# Patient Record
Sex: Female | Born: 1935 | Race: White | Hispanic: No | Marital: Married | Smoking: Never smoker
Health system: Southern US, Community
[De-identification: ages and names within clinical notes are randomized; demographics above are authoritative.]

## PROBLEM LIST (undated history)

## (undated) DIAGNOSIS — Z8489 Family history of other specified conditions: Secondary | ICD-10-CM

## (undated) DIAGNOSIS — R011 Cardiac murmur, unspecified: Secondary | ICD-10-CM

## (undated) DIAGNOSIS — R51 Headache: Secondary | ICD-10-CM

## (undated) DIAGNOSIS — Z8673 Personal history of transient ischemic attack (TIA), and cerebral infarction without residual deficits: Secondary | ICD-10-CM

## (undated) DIAGNOSIS — C801 Malignant (primary) neoplasm, unspecified: Secondary | ICD-10-CM

## (undated) DIAGNOSIS — R410 Disorientation, unspecified: Secondary | ICD-10-CM

## (undated) DIAGNOSIS — R112 Nausea with vomiting, unspecified: Secondary | ICD-10-CM

## (undated) DIAGNOSIS — R569 Unspecified convulsions: Secondary | ICD-10-CM

## (undated) DIAGNOSIS — F419 Anxiety disorder, unspecified: Secondary | ICD-10-CM

## (undated) DIAGNOSIS — I10 Essential (primary) hypertension: Secondary | ICD-10-CM

## (undated) DIAGNOSIS — M1990 Unspecified osteoarthritis, unspecified site: Secondary | ICD-10-CM

## (undated) DIAGNOSIS — Z9889 Other specified postprocedural states: Secondary | ICD-10-CM

## (undated) HISTORY — PX: JOINT REPLACEMENT: SHX530

## (undated) HISTORY — PX: IVC FILTER PLACEMENT (ARMC HX): HXRAD1551

## (undated) HISTORY — PX: EYE SURGERY: SHX253

## (undated) HISTORY — PX: WRIST FRACTURE SURGERY: SHX121

---

## 1993-02-11 HISTORY — PX: BREAST SURGERY: SHX581

## 2010-08-29 ENCOUNTER — Ambulatory Visit (HOSPITAL_COMMUNITY)
Admission: RE | Admit: 2010-08-29 | Discharge: 2010-08-29 | Disposition: A | Discharge status: Home / Self Care | Payer: Medicare Other | Source: Ambulatory Visit | Attending: Orthopedic Surgery | Admitting: Orthopedic Surgery

## 2010-08-29 DIAGNOSIS — Z01812 Encounter for preprocedural laboratory examination: Secondary | ICD-10-CM | POA: Insufficient documentation

## 2010-08-29 LAB — URINALYSIS, ROUTINE W REFLEX MICROSCOPIC
Bilirubin Urine: NEGATIVE
Hgb urine dipstick: NEGATIVE
Protein, ur: NEGATIVE mg/dL
Specific Gravity, Urine: 1.029 (ref 1.005–1.030)
Urobilinogen, UA: 0.2 mg/dL (ref 0.0–1.0)

## 2010-08-29 LAB — CBC
HCT: 37.3 % (ref 36.0–46.0)
MCH: 32.5 pg (ref 26.0–34.0)
MCV: 92.6 fL (ref 78.0–100.0)
Platelets: 197 10*3/uL (ref 150–400)
RBC: 4.03 MIL/uL (ref 3.87–5.11)

## 2010-08-29 LAB — COMPREHENSIVE METABOLIC PANEL
ALT: 19 U/L (ref 0–35)
Alkaline Phosphatase: 130 U/L — ABNORMAL HIGH (ref 39–117)
CO2: 23 mEq/L (ref 19–32)
Calcium: 9.4 mg/dL (ref 8.4–10.5)
Chloride: 93 mEq/L — ABNORMAL LOW (ref 96–112)
GFR calc Af Amer: >60 mL/min (ref >60)
GFR calc non Af Amer: >60 mL/min (ref >60)
Glucose, Bld: 186 mg/dL — ABNORMAL HIGH (ref 70–99)
Sodium: 131 mEq/L — ABNORMAL LOW (ref 135–145)
Total Bilirubin: 0.2 mg/dL — ABNORMAL LOW (ref 0.3–1.2)

## 2010-08-30 ENCOUNTER — Inpatient Hospital Stay (HOSPITAL_COMMUNITY): Payer: Medicare Other

## 2010-08-30 ENCOUNTER — Inpatient Hospital Stay (HOSPITAL_COMMUNITY)
Admission: RE | Admit: 2010-08-30 | Discharge: 2010-08-31 | DRG: 484 | Disposition: A | Discharge status: Home / Self Care | Payer: Medicare Other | Source: Ambulatory Visit | Attending: Orthopedic Surgery | Admitting: Orthopedic Surgery

## 2010-08-30 DIAGNOSIS — S42309A Unspecified fracture of shaft of humerus, unspecified arm, initial encounter for closed fracture: Principal | ICD-10-CM | POA: Diagnosis present

## 2010-08-30 LAB — GLUCOSE, CAPILLARY
Glucose-Capillary: 128 mg/dL — ABNORMAL HIGH (ref 70–99)
Glucose-Capillary: 156 mg/dL — ABNORMAL HIGH (ref 70–99)
Glucose-Capillary: 127 mg/dL — ABNORMAL HIGH (ref 70–99)

## 2010-08-30 LAB — TYPE AND SCREEN: ABO/RH(D): O POS

## 2010-08-30 LAB — ABO/RH: ABO/RH(D): O POS

## 2010-09-20 NOTE — Op Note (Signed)
Kristina Phelps, Kristina Phelps NO.:  [id number]  MEDICAL RECORD NO.:  [id number]  LOCATION:  5004                         FACILITY:  [facility]  PHYSICIAN:  Vania Rea. Kierre Hintz, M.D.  DATE OF BIRTH:  02-16-1935  DATE OF PROCEDURE:  08/30/2010 DATE OF DISCHARGE:                              OPERATIVE REPORT   PREOPERATIVE DIAGNOSIS:  Comminuted displaced left four-part proximal humerus fracture.  POSTOPERATIVE DIAGNOSIS:  Comminuted displaced left four-part proximal humerus fracture.  PROCEDURE:  Left reverse shoulder arthroplasty utilizing a cemented size 10 DePuy stem with a +3 poly and a 42 eccentric glenosphere.  SURGEON:  Vania Rea. Ruhan Borak, MD  ASSISTANT:  Lucita Lora. Shuford, PA-C  ANESTHESIA:  General endotracheal as well as an interscalene block.  ESTIMATED BLOOD LOSS:  350 mL.  DRAINS:  None.  HISTORY:  Ms. Yarbro is a 75 year old female who unfortunately fell 2 days ago sustaining a severely displaced four-part left shoulder fracture-dislocation.  She had undergone an initial closed reduction in the [facility], was referred to our office yesterday where she was evaluated and found to have the above-mentioned severely displaced four-part proximal humeral fracture.  She did appeared to be grossly neurovascularly intact at the time of evaluation, although this was difficult to ascertain due to her degree of pain and guarding.  Her radiographs confirmed the degree of displacement and she was subsequently counseled on treatment options for her shoulder condition.  Preoperatively, I counseled Ms. Jarvie on various treatment options as well as risks versus benefits thereof.  Possible surgical complications were reviewed including potential for bleeding, infection, neurovascular injury, malunion, nonunion, failure of fixation, failure of the implant and stability and possible need for additional surgery. She understands and accepts and  agrees with our planned procedure.  PROCEDURE IN DETAIL:  After undergoing routine preop evaluation, the patient received prophylactic antibiotics and an interscalene block was established in holding area by the Anesthesia Department.  Placed supine on the operative table, underwent smooth induction of a general endotracheal anesthesia.  Placed into beach-chair position and appropriately padded and protected.  The left shoulder girdle region was then sterilely prepped and draped in standard fashion.  Time-out was called.  An anterior approach to the left shoulder was made through a 12- cm incision along the deltopectoral interval.  Skin flaps were elevated and electrocautery was used for hemostasis.  The deltopectoral interval was then identified and cephalic vein was then identified, and retracted laterally with a deltoid.  This interval was then developed from proximal to distal.  A Brown retractor was inflated.  Placed in the subacromial/subdeltoid bursal region.  Conjoined tendon was then retracted medially.  There was a high degree of comminution of the fracture and we identified the biceps tendon and used this to develop the interval between the greater and lesser tuberosities and overlying periosteum was then divided.  The tuberosities were then mobilized and the bulk of the bone fragments attached to these were then debrided away, leaving a small cuff of bone with the adjacent tendon for later repair.  We developed the rotator interval mobilizing the subscapularis and supraspinatus independently.  A series of #2 fiber wires were then passed through  the tendinous portion of the subscapularis as well as the supra and infraspinatus to assist for later repair.  The humeral head articular segment had been spun 180 degrees and impacted against the femoral shaft and this was then removed as a single fragment.  Once we had mobilized the tuberosities in the humeral shaft.  We then  gained access to the glenoid and placed a series of retractors around the periphery of the glenoid and performed a capsulotomy and then resection and removal of the labrum as well as proximal stump of the biceps.  We then placed a guide pin into the center of the glenoid and reamed to a subchondral bony bed and peripheral reamer was then used to complete preparation of the glenoid.  The central peg hole was then drilled with appropriate drill.  The glenoid was irrigated, meticulously cleaned, and then the glenoid baseplate was impacted into position.  We then transfixed baseplate with a superior and inferior locking screws and anterior and posterior nonlocking screws at the appropriate depth with an excellent bony purchase achieved.  Screws were then locked.  A 42 eccentric glenosphere was then placed over the baseplate and screwed into position with excellent fixation.  Once this was completed, we then turned our attention back to the humeral shaft.  We performed an initial fitting of a size.  We then performed hand reaming up to a size 10 and placed a size 10 stem and found that additional preparation of the humeral shaft would be needed.  The size 10 fluted reaming guide was then introduced into the humeral canal at 0 degrees of retroversion and the size 1 reamer was then used to prepare the residual metaphyseal portion of the humerus.  Once this was completed to our satisfaction, the size 10 stem trial was once again placed and this showed excellent fit with +3 poly.  At this point, a distal cement plug was placed at the appropriate depth of the humeral shaft.  The shaft was irrigated and dried.  Cement was mixed and at the appropriate consistency, the cement was introduced into the humeral canal in retrograde fashion.  The final size 10 stem was then placed to the appropriate depth and would preloaded with 2 FiberWire sutures through the eyelid on the medial collar of the humeral  stem and the stem was then seated to the proper level at 0 degrees of retroversion.  Meticulous removal of all extra cement was completed.  Once the cement had hardened, we performed trial reduction showing good soft tissue balance.  At this point, we then performed a repair of the tuberosities back to the stem and shaft using the previously placed sutures repairing the greater and lesser tuberosities around the lateral margin of the stem and the sutures through the eyelid at the medial collar of the stem up to the tuberosities to further reattach the tuberosities down to the stem and implant construct.  The overall stability was much to our satisfaction. We then performed a biceps tenodesis.  The wound was then copiously irrigated.  Hemostasis was obtained.  The deltopectoral interval was then reapproximated with a #1 Vicryl sutures.  A 2-0 Vicryl was used for the subcu layer and intracuticular 3-0 Monocryl for the skin followed by Steri-Strips.  Dry dressing was applied.  The patient was awakened, left arm placed in a sling.  The patient was awakened, extubated, and taken to the recovery room in stable condition.     Vania Rea. Callie Bunyard, M.D.  KMS/MEDQ  D:  08/30/2010  T:  08/31/2010  Job:  [id number]  Electronically Signed by Francena Hanly M.D. on 09/20/2010 01:30:39 PM

## 2010-11-20 NOTE — Discharge Summary (Signed)
  NAMEMARILIN, Kristina Phelps NO.:  [id number]  MEDICAL RECORD NO.:  [id number]  LOCATION:  5004                         FACILITY:  [facility]  PHYSICIAN:  Vania Rea. Arush Gatliff, M.D.  DATE OF BIRTH:  [birth year]  DATE OF ADMISSION:  08/30/2010 DATE OF DISCHARGE:  08/31/2010                              DISCHARGE SUMMARY   ADMISSION DIAGNOSES: 1. Four-part proximal humerus fracture in left. 2. History of hypertension. 3. History of deep vein thrombosis.  DISCHARGE DIAGNOSES: 1. Four-part proximal humerus fracture in left. 2. History of hypertension. 3. History of deep vein thrombosis. 4. Status post left shoulder reverse arthroplasty for a four-part     humerus fracture.  BRIEF HISTORY:  Kristina Phelps is a 75 year old female who fell sustaining a severely displaced four-part left proximal humerus fracture.  Closed reduction was performed in the local emergency room in [place] and she is seen in our office.  She continued to have significant displacement and due to her pain and now displacement operative intervention was recommended.  The risks and benefits were discussed in length and she wished to proceed.  HOSPITAL COURSE:  The patient admitted and underwent the above-mentioned procedure and tolerated it well.  All appropriate IV antibiotics were utilized.  Postop, the patient did extremely well and remained neurovascularly intact and hemodynamically stable as well as orthopedically did well with pain control.  The following day she was felt medically stable for discharge to home.  LABORATORY DATA:  In the chart.  DISCHARGE MEDICATIONS AND PLAN:  The patient will be discharged to home. She is on the following medications per home med reconciliation; simvastatin, Bystolic, Dilantin, and losartan.  Prescriptions for oxycodone and methocarbamol were provided.  She will follow up in our office in 2 weeks.  Call for time.  She may _change dressing but may shower on day  5. Resume other home meds as above.  Resume regular diet.  CONDITION ON DISCHARGE:  Stable and improved.     Tracy A. Shuford, P.A.-C.   ______________________________ Vania Rea. Nazareth Norenberg, M.D.    TAS/MEDQ  D:  10/26/2010  T:  10/26/2010  Job:  [id number]  Electronically Signed by Ralene Bathe P.A.-C. on 11/12/2010 05:39:56 PM Electronically Signed by Francena Hanly M.D. on 11/20/2010 06:44:36 PM

## 2013-06-21 MED ORDER — CEFAZOLIN SODIUM-DEXTROSE 2-3 GM-% IV SOLR
2.0000 g | INTRAVENOUS | Status: AC
  Administered 2013-06-22: 2 g via INTRAVENOUS
  Filled 2013-06-21: qty 50

## 2013-06-21 MED ORDER — LACTATED RINGERS IV SOLN
INTRAVENOUS | Status: DC
  Administered 2013-06-22: 12:00:00 via INTRAVENOUS

## 2013-06-21 MED ORDER — CHLORHEXIDINE GLUCONATE 4 % EX LIQD
60.0000 mL | Freq: Once | CUTANEOUS | Status: DC
  Filled 2013-06-21: qty 60

## 2013-06-21 NOTE — Progress Notes (Signed)
I have attempted to call this patient 3-4 times with the phone # listed and all I get is a busy signal.   DA

## 2013-06-22 ENCOUNTER — Inpatient Hospital Stay (HOSPITAL_COMMUNITY): Payer: Medicare Other | Admitting: Anesthesiology

## 2013-06-22 ENCOUNTER — Encounter (HOSPITAL_COMMUNITY): Payer: Self-pay | Admitting: Pharmacy Technician

## 2013-06-22 ENCOUNTER — Encounter (HOSPITAL_COMMUNITY): Payer: Medicare Other | Admitting: Anesthesiology

## 2013-06-22 ENCOUNTER — Encounter (HOSPITAL_COMMUNITY)
Admission: RE | Disposition: A | Discharge status: Home / Self Care | Payer: Self-pay | Source: Ambulatory Visit | Attending: Orthopedic Surgery

## 2013-06-22 ENCOUNTER — Inpatient Hospital Stay (HOSPITAL_COMMUNITY): Payer: Medicare Other

## 2013-06-22 ENCOUNTER — Inpatient Hospital Stay (HOSPITAL_COMMUNITY)
Admission: RE | Admit: 2013-06-22 | Discharge: 2013-06-23 | DRG: 494 | Disposition: A | Discharge status: Home / Self Care | Payer: Medicare Other | Source: Ambulatory Visit | Attending: Orthopedic Surgery | Admitting: Orthopedic Surgery

## 2013-06-22 ENCOUNTER — Encounter (HOSPITAL_COMMUNITY): Payer: Self-pay | Admitting: Anesthesiology

## 2013-06-22 DIAGNOSIS — Z888 Allergy status to other drugs, medicaments and biological substances status: Secondary | ICD-10-CM

## 2013-06-22 DIAGNOSIS — G40909 Epilepsy, unspecified, not intractable, without status epilepticus: Secondary | ICD-10-CM | POA: Diagnosis present

## 2013-06-22 DIAGNOSIS — Z853 Personal history of malignant neoplasm of breast: Secondary | ICD-10-CM

## 2013-06-22 DIAGNOSIS — S42309A Unspecified fracture of shaft of humerus, unspecified arm, initial encounter for closed fracture: Secondary | ICD-10-CM | POA: Diagnosis present

## 2013-06-22 DIAGNOSIS — R011 Cardiac murmur, unspecified: Secondary | ICD-10-CM | POA: Diagnosis present

## 2013-06-22 DIAGNOSIS — Y831 Surgical operation with implant of artificial internal device as the cause of abnormal reaction of the patient, or of later complication, without mention of misadventure at the time of the procedure: Secondary | ICD-10-CM | POA: Diagnosis present

## 2013-06-22 DIAGNOSIS — F411 Generalized anxiety disorder: Secondary | ICD-10-CM | POA: Diagnosis present

## 2013-06-22 DIAGNOSIS — Z9660 Presence of unspecified orthopedic joint implant: Principal | ICD-10-CM | POA: Diagnosis present

## 2013-06-22 DIAGNOSIS — I10 Essential (primary) hypertension: Secondary | ICD-10-CM | POA: Diagnosis present

## 2013-06-22 DIAGNOSIS — Z8673 Personal history of transient ischemic attack (TIA), and cerebral infarction without residual deficits: Secondary | ICD-10-CM

## 2013-06-22 DIAGNOSIS — Z7982 Long term (current) use of aspirin: Secondary | ICD-10-CM

## 2013-06-22 DIAGNOSIS — W19XXXA Unspecified fall, initial encounter: Secondary | ICD-10-CM | POA: Diagnosis present

## 2013-06-22 DIAGNOSIS — T84049A Periprosthetic fracture around unspecified internal prosthetic joint, initial encounter: Principal | ICD-10-CM | POA: Diagnosis present

## 2013-06-22 HISTORY — DX: Anxiety disorder, unspecified: F41.9

## 2013-06-22 HISTORY — DX: Nausea with vomiting, unspecified: R11.2

## 2013-06-22 HISTORY — DX: Cardiac murmur, unspecified: R01.1

## 2013-06-22 HISTORY — DX: Personal history of transient ischemic attack (TIA), and cerebral infarction without residual deficits: Z86.73

## 2013-06-22 HISTORY — DX: Malignant (primary) neoplasm, unspecified: C80.1

## 2013-06-22 HISTORY — DX: Unspecified convulsions: R56.9

## 2013-06-22 HISTORY — DX: Headache: R51

## 2013-06-22 HISTORY — DX: Unspecified osteoarthritis, unspecified site: M19.90

## 2013-06-22 HISTORY — PX: REVERSE SHOULDER ARTHROPLASTY: SHX5054

## 2013-06-22 HISTORY — DX: Essential (primary) hypertension: I10

## 2013-06-22 HISTORY — DX: Other specified postprocedural states: Z98.890

## 2013-06-22 LAB — CBC WITH DIFFERENTIAL/PLATELET
Basophils Absolute: 0 10*3/uL (ref 0.0–0.1)
Basophils Relative: 0 % (ref 0–1)
EOS PCT: 2 % (ref 0–5)
Eosinophils Absolute: 0.1 10*3/uL (ref 0.0–0.7)
HEMATOCRIT: 41.3 % (ref 36.0–46.0)
HEMOGLOBIN: 13.7 g/dL (ref 12.0–15.0)
Lymphocytes Relative: 27 % (ref 12–46)
Lymphs Abs: 1.8 10*3/uL (ref 0.7–4.0)
MCH: 30.9 pg (ref 26.0–34.0)
MCHC: 33.2 g/dL (ref 30.0–36.0)
MCV: 93 fL (ref 78.0–100.0)
MONO ABS: 0.6 10*3/uL (ref 0.1–1.0)
Monocytes Relative: 9 % (ref 3–12)
Neutro Abs: 4.1 10*3/uL (ref 1.7–7.7)
Neutrophils Relative %: 62 % (ref 43–77)
Platelets: 158 10*3/uL (ref 150–400)
RBC: 4.44 MIL/uL (ref 3.87–5.11)
RDW: 15.2 % (ref 11.5–15.5)
WBC: 6.6 10*3/uL (ref 4.0–10.5)

## 2013-06-22 LAB — COMPREHENSIVE METABOLIC PANEL
ALT: 18 U/L (ref 0–35)
AST: 24 U/L (ref 0–37)
Albumin: 3.5 g/dL (ref 3.5–5.2)
Alkaline Phosphatase: 130 U/L — ABNORMAL HIGH (ref 39–117)
BUN: 16 mg/dL (ref 6–23)
CALCIUM: 9.2 mg/dL (ref 8.4–10.5)
CO2: 23 mEq/L (ref 19–32)
CREATININE: 0.64 mg/dL (ref 0.50–1.10)
Chloride: 100 mEq/L (ref 96–112)
GFR calc Af Amer: >90 mL/min (ref >90)
GFR, EST NON AFRICAN AMERICAN: 85 mL/min — AB (ref >90)
GLUCOSE: 108 mg/dL — AB (ref 70–99)
Potassium: 4.2 mEq/L (ref 3.7–5.3)
Sodium: 137 mEq/L (ref 137–147)
Total Bilirubin: 0.4 mg/dL (ref 0.3–1.2)
Total Protein: 6.9 g/dL (ref 6.0–8.3)

## 2013-06-22 LAB — TYPE AND SCREEN
ABO/RH(D): O POS
Antibody Screen: NEGATIVE

## 2013-06-22 LAB — PROTIME-INR
INR: 1.01 (ref 0.00–1.49)
PROTHROMBIN TIME: 13.1 s (ref 11.6–15.2)

## 2013-06-22 LAB — APTT: APTT: 24 s (ref 24–37)

## 2013-06-22 SURGERY — ARTHROPLASTY, SHOULDER, TOTAL, REVERSE
Anesthesia: General | Site: Shoulder | Laterality: Left

## 2013-06-22 MED ORDER — PHENYTOIN SODIUM EXTENDED 100 MG PO CAPS
100.0000 mg | ORAL_CAPSULE | Freq: Three times a day (TID) | ORAL | Status: DC
  Administered 2013-06-22 – 2013-06-23 (×2): 100 mg via ORAL
  Filled 2013-06-22 (×5): qty 1

## 2013-06-22 MED ORDER — ARTIFICIAL TEARS OP OINT
TOPICAL_OINTMENT | OPHTHALMIC | Status: AC
  Filled 2013-06-22: qty 3.5

## 2013-06-22 MED ORDER — NEOSTIGMINE METHYLSULFATE 10 MG/10ML IV SOLN
INTRAVENOUS | Status: AC
  Filled 2013-06-22: qty 1

## 2013-06-22 MED ORDER — DIPHENHYDRAMINE HCL 12.5 MG/5ML PO ELIX
12.5000 mg | ORAL_SOLUTION | ORAL | Status: DC | PRN
  Filled 2013-06-22: qty 10

## 2013-06-22 MED ORDER — PHENYLEPHRINE HCL 10 MG/ML IJ SOLN
10.0000 mg | INTRAVENOUS | Status: DC | PRN
  Administered 2013-06-22: 20 ug/min via INTRAVENOUS

## 2013-06-22 MED ORDER — LOSARTAN POTASSIUM 50 MG PO TABS
100.0000 mg | ORAL_TABLET | Freq: Every day | ORAL | Status: DC
  Administered 2013-06-23: 100 mg via ORAL
  Filled 2013-06-22: qty 2

## 2013-06-22 MED ORDER — OXYCODONE HCL 5 MG PO TABS: 5.0000 mg | ORAL_TABLET | Freq: Once | ORAL | Status: DC | PRN

## 2013-06-22 MED ORDER — ONDANSETRON HCL 4 MG/2ML IJ SOLN
INTRAMUSCULAR | Status: AC
  Administered 2013-06-22: 4 mg
  Filled 2013-06-22: qty 2

## 2013-06-22 MED ORDER — ONDANSETRON HCL 4 MG PO TABS: 4.0000 mg | ORAL_TABLET | Freq: Four times a day (QID) | ORAL | Status: DC | PRN

## 2013-06-22 MED ORDER — PHENOL 1.4 % MT LIQD
1.0000 | OROMUCOSAL | Status: DC | PRN
  Filled 2013-06-22: qty 177

## 2013-06-22 MED ORDER — ACETAMINOPHEN 650 MG RE SUPP: 650.0000 mg | Freq: Four times a day (QID) | RECTAL | Status: DC | PRN

## 2013-06-22 MED ORDER — ROCURONIUM BROMIDE 100 MG/10ML IV SOLN
INTRAVENOUS | Status: DC | PRN
  Administered 2013-06-22: 10 mg via INTRAVENOUS
  Administered 2013-06-22: 40 mg via INTRAVENOUS

## 2013-06-22 MED ORDER — PROPOFOL 10 MG/ML IV BOLUS
INTRAVENOUS | Status: AC
  Filled 2013-06-22: qty 20

## 2013-06-22 MED ORDER — NEBIVOLOL HCL 10 MG PO TABS
10.0000 mg | ORAL_TABLET | Freq: Every day | ORAL | Status: DC
  Administered 2013-06-23: 10 mg via ORAL
  Filled 2013-06-22: qty 1

## 2013-06-22 MED ORDER — MIDAZOLAM HCL 2 MG/2ML IJ SOLN
1.0000 mg | INTRAMUSCULAR | Status: DC | PRN
  Administered 2013-06-22: 0.5 mg via INTRAVENOUS

## 2013-06-22 MED ORDER — PHENYTOIN SODIUM EXTENDED 100 MG PO CAPS
100.0000 mg | ORAL_CAPSULE | Freq: Once | ORAL | Status: AC
  Administered 2013-06-22: 100 mg via ORAL
  Filled 2013-06-22: qty 1

## 2013-06-22 MED ORDER — ONDANSETRON HCL 4 MG/2ML IJ SOLN
INTRAMUSCULAR | Status: AC
  Filled 2013-06-22: qty 2

## 2013-06-22 MED ORDER — FENTANYL CITRATE 0.05 MG/ML IJ SOLN
INTRAMUSCULAR | Status: AC
  Administered 2013-06-22: 50 ug via INTRAVENOUS
  Filled 2013-06-22: qty 2

## 2013-06-22 MED ORDER — FLEET ENEMA 7-19 GM/118ML RE ENEM
1.0000 | ENEMA | Freq: Once | RECTAL | Status: AC | PRN
  Filled 2013-06-22: qty 1

## 2013-06-22 MED ORDER — DEXAMETHASONE SODIUM PHOSPHATE 4 MG/ML IJ SOLN
INTRAMUSCULAR | Status: AC
  Filled 2013-06-22: qty 1

## 2013-06-22 MED ORDER — LOSARTAN POTASSIUM-HCTZ 100-12.5 MG PO TABS: 1.0000 | ORAL_TABLET | Freq: Every day | ORAL | Status: DC

## 2013-06-22 MED ORDER — PROPOFOL 10 MG/ML IV BOLUS
INTRAVENOUS | Status: DC | PRN
  Administered 2013-06-22: 110 mg via INTRAVENOUS

## 2013-06-22 MED ORDER — OXYCODONE-ACETAMINOPHEN 5-325 MG PO TABS
1.0000 | ORAL_TABLET | ORAL | Status: DC | PRN
  Administered 2013-06-23: 1 via ORAL
  Filled 2013-06-22: qty 1

## 2013-06-22 MED ORDER — LACTATED RINGERS IV SOLN
INTRAVENOUS | Status: DC
  Administered 2013-06-23: 07:00:00 via INTRAVENOUS

## 2013-06-22 MED ORDER — METOCLOPRAMIDE HCL 5 MG/ML IJ SOLN: 10.0000 mg | Freq: Once | INTRAMUSCULAR | Status: DC | PRN

## 2013-06-22 MED ORDER — GLYCOPYRROLATE 0.2 MG/ML IJ SOLN
INTRAMUSCULAR | Status: AC
  Filled 2013-06-22: qty 2

## 2013-06-22 MED ORDER — ONDANSETRON HCL 4 MG/2ML IJ SOLN: 4.0000 mg | Freq: Four times a day (QID) | INTRAMUSCULAR | Status: DC | PRN

## 2013-06-22 MED ORDER — METOCLOPRAMIDE HCL 5 MG PO TABS: 5.0000 mg | ORAL_TABLET | Freq: Three times a day (TID) | ORAL | Status: DC | PRN

## 2013-06-22 MED ORDER — SIMVASTATIN 20 MG PO TABS
20.0000 mg | ORAL_TABLET | Freq: Every day | ORAL | Status: DC
  Administered 2013-06-22: 20 mg via ORAL
  Filled 2013-06-22 (×2): qty 1

## 2013-06-22 MED ORDER — LIDOCAINE HCL (CARDIAC) 20 MG/ML IV SOLN
INTRAVENOUS | Status: DC | PRN
  Administered 2013-06-22: 50 mg via INTRAVENOUS

## 2013-06-22 MED ORDER — ACETAMINOPHEN 325 MG PO TABS: 650.0000 mg | ORAL_TABLET | Freq: Four times a day (QID) | ORAL | Status: DC | PRN

## 2013-06-22 MED ORDER — GLYCOPYRROLATE 0.2 MG/ML IJ SOLN
INTRAMUSCULAR | Status: DC | PRN
  Administered 2013-06-22: 0.4 mg via INTRAVENOUS

## 2013-06-22 MED ORDER — PHENYLEPHRINE HCL 10 MG/ML IJ SOLN
INTRAMUSCULAR | Status: DC | PRN
  Administered 2013-06-22 (×3): 80 ug via INTRAVENOUS

## 2013-06-22 MED ORDER — CEFAZOLIN SODIUM-DEXTROSE 2-3 GM-% IV SOLR
2.0000 g | Freq: Four times a day (QID) | INTRAVENOUS | Status: AC
  Administered 2013-06-22 – 2013-06-23 (×3): 2 g via INTRAVENOUS
  Filled 2013-06-22 (×3): qty 50

## 2013-06-22 MED ORDER — ROCURONIUM BROMIDE 50 MG/5ML IV SOLN
INTRAVENOUS | Status: AC
  Filled 2013-06-22: qty 1

## 2013-06-22 MED ORDER — POLYETHYLENE GLYCOL 3350 17 G PO PACK
17.0000 g | PACK | Freq: Every day | ORAL | Status: DC | PRN
  Filled 2013-06-22: qty 1

## 2013-06-22 MED ORDER — ARTIFICIAL TEARS OP OINT
TOPICAL_OINTMENT | OPHTHALMIC | Status: DC | PRN
  Administered 2013-06-22: 1 via OPHTHALMIC

## 2013-06-22 MED ORDER — FENTANYL CITRATE 0.05 MG/ML IJ SOLN
INTRAMUSCULAR | Status: AC
  Filled 2013-06-22: qty 5

## 2013-06-22 MED ORDER — FENTANYL CITRATE 0.05 MG/ML IJ SOLN
50.0000 ug | INTRAMUSCULAR | Status: DC | PRN
  Administered 2013-06-22: 50 ug via INTRAVENOUS

## 2013-06-22 MED ORDER — MIDAZOLAM HCL 2 MG/2ML IJ SOLN
INTRAMUSCULAR | Status: AC
  Administered 2013-06-22: 0.5 mg via INTRAVENOUS
  Filled 2013-06-22: qty 2

## 2013-06-22 MED ORDER — DEXAMETHASONE SODIUM PHOSPHATE 4 MG/ML IJ SOLN
INTRAMUSCULAR | Status: DC | PRN
  Administered 2013-06-22: 4 mg via INTRAVENOUS

## 2013-06-22 MED ORDER — HYDROCHLOROTHIAZIDE 12.5 MG PO CAPS
12.5000 mg | ORAL_CAPSULE | Freq: Every day | ORAL | Status: DC
  Administered 2013-06-23: 12.5 mg via ORAL
  Filled 2013-06-22: qty 1

## 2013-06-22 MED ORDER — OXYCODONE HCL 5 MG/5ML PO SOLN: 5.0000 mg | Freq: Once | ORAL | Status: DC | PRN

## 2013-06-22 MED ORDER — BISACODYL 5 MG PO TBEC: 5.0000 mg | DELAYED_RELEASE_TABLET | Freq: Every day | ORAL | Status: DC | PRN

## 2013-06-22 MED ORDER — NEOSTIGMINE METHYLSULFATE 10 MG/10ML IV SOLN
INTRAVENOUS | Status: DC | PRN
  Administered 2013-06-22: 2 mg via INTRAVENOUS

## 2013-06-22 MED ORDER — MENTHOL 3 MG MT LOZG
1.0000 | LOZENGE | OROMUCOSAL | Status: DC | PRN
  Filled 2013-06-22: qty 9

## 2013-06-22 MED ORDER — ASPIRIN 325 MG PO TABS
650.0000 mg | ORAL_TABLET | Freq: Every day | ORAL | Status: DC | PRN
  Administered 2013-06-23: 650 mg via ORAL
  Filled 2013-06-22: qty 2

## 2013-06-22 MED ORDER — ALUM & MAG HYDROXIDE-SIMETH 200-200-20 MG/5ML PO SUSP: 30.0000 mL | ORAL | Status: DC | PRN

## 2013-06-22 MED ORDER — ONDANSETRON HCL 4 MG/2ML IJ SOLN
INTRAMUSCULAR | Status: DC | PRN
  Administered 2013-06-22: 4 mg via INTRAVENOUS

## 2013-06-22 MED ORDER — ONDANSETRON HCL 4 MG/2ML IJ SOLN: 4.0000 mg | Freq: Once | INTRAMUSCULAR | Status: DC | PRN

## 2013-06-22 MED ORDER — HYDROMORPHONE HCL PF 1 MG/ML IJ SOLN: 0.2500 mg | INTRAMUSCULAR | Status: DC | PRN

## 2013-06-22 MED ORDER — DOCUSATE SODIUM 100 MG PO CAPS
100.0000 mg | ORAL_CAPSULE | Freq: Two times a day (BID) | ORAL | Status: DC
  Administered 2013-06-22 – 2013-06-23 (×2): 100 mg via ORAL
  Filled 2013-06-22 (×3): qty 1

## 2013-06-22 MED ORDER — METOCLOPRAMIDE HCL 5 MG/ML IJ SOLN: 5.0000 mg | Freq: Three times a day (TID) | INTRAMUSCULAR | Status: DC | PRN

## 2013-06-22 MED ORDER — SODIUM CHLORIDE 0.9 % IR SOLN
Status: DC | PRN
  Administered 2013-06-22: 1000 mL

## 2013-06-22 MED ORDER — FENTANYL CITRATE 0.05 MG/ML IJ SOLN
INTRAMUSCULAR | Status: DC | PRN
  Administered 2013-06-22 (×2): 25 ug via INTRAVENOUS
  Administered 2013-06-22: 50 ug via INTRAVENOUS
  Administered 2013-06-22: 100 ug via INTRAVENOUS
  Administered 2013-06-22 (×2): 25 ug via INTRAVENOUS

## 2013-06-22 MED ORDER — LACTATED RINGERS IV SOLN
INTRAVENOUS | Status: DC | PRN
  Administered 2013-06-22 (×2): via INTRAVENOUS

## 2013-06-22 SURGICAL SUPPLY — 80 items
4.5 Threaded Cerclage Pin 298.803 ×3 IMPLANT
BLADE 10 SAFETY STRL DISP (BLADE) ×3 IMPLANT
BLADE SAW SGTL 83.5X18.5 (BLADE) ×3 IMPLANT
BONE CHIP PRESERV 20CC (Bone Implant) ×3 IMPLANT
BRUSH FEMORAL CANAL (MISCELLANEOUS) IMPLANT
CABLE 1.7 (Orthopedic Implant) ×4 IMPLANT
CABLE 1.7MM (Orthopedic Implant) ×2 IMPLANT
CLOSURE WOUND 1/2 X4 (GAUZE/BANDAGES/DRESSINGS) ×1
COVER SURGICAL LIGHT HANDLE (MISCELLANEOUS) ×6 IMPLANT
DERMABOND ADHESIVE PROPEN (GAUZE/BANDAGES/DRESSINGS) ×2
DERMABOND ADVANCED .7 DNX6 (GAUZE/BANDAGES/DRESSINGS) ×1 IMPLANT
DRAPE C-ARM 42X72 X-RAY (DRAPES) ×3 IMPLANT
DRAPE INCISE IOBAN 66X45 STRL (DRAPES) ×3 IMPLANT
DRAPE SURG 17X11 SM STRL (DRAPES) ×3 IMPLANT
DRAPE U-SHAPE 47X51 STRL (DRAPES) ×3 IMPLANT
DRILL BIT 7/64X5 (BIT) IMPLANT
DRSG AQUACEL AG ADV 3.5X10 (GAUZE/BANDAGES/DRESSINGS) ×3 IMPLANT
DRSG MEPILEX BORDER 4X12 (GAUZE/BANDAGES/DRESSINGS) ×3 IMPLANT
DRSG MEPILEX BORDER 4X8 (GAUZE/BANDAGES/DRESSINGS) IMPLANT
DURAPREP 26ML APPLICATOR (WOUND CARE) ×3 IMPLANT
ELECT BLADE 4.0 EZ CLEAN MEGAD (MISCELLANEOUS) ×3
ELECT CAUTERY BLADE 6.4 (BLADE) ×3 IMPLANT
ELECT REM PT RETURN 9FT ADLT (ELECTROSURGICAL) ×3
ELECTRODE BLDE 4.0 EZ CLN MEGD (MISCELLANEOUS) ×1 IMPLANT
ELECTRODE REM PT RTRN 9FT ADLT (ELECTROSURGICAL) ×1 IMPLANT
FACESHIELD WRAPAROUND (MASK) IMPLANT
GLOVE BIO SURGEON STRL SZ7.5 (GLOVE) ×3 IMPLANT
GLOVE BIO SURGEON STRL SZ8 (GLOVE) ×3 IMPLANT
GLOVE EUDERMIC 7 POWDERFREE (GLOVE) ×3 IMPLANT
GLOVE SS BIOGEL STRL SZ 7.5 (GLOVE) ×1 IMPLANT
GLOVE SUPERSENSE BIOGEL SZ 7.5 (GLOVE) ×2
GOWN STRL REUS W/ TWL LRG LVL3 (GOWN DISPOSABLE) IMPLANT
GOWN STRL REUS W/ TWL XL LVL3 (GOWN DISPOSABLE) ×2 IMPLANT
GOWN STRL REUS W/TWL LRG LVL3 (GOWN DISPOSABLE)
GOWN STRL REUS W/TWL XL LVL3 (GOWN DISPOSABLE) ×4
HANDPIECE INTERPULSE COAX TIP (DISPOSABLE)
KIT BASIN OR (CUSTOM PROCEDURE TRAY) ×3 IMPLANT
KIT ROOM TURNOVER OR (KITS) ×3 IMPLANT
MANIFOLD NEPTUNE II (INSTRUMENTS) ×3 IMPLANT
NDL SUT 6 .5 CRC .975X.05 MAYO (NEEDLE) IMPLANT
NEEDLE HYPO 25GX1X1/2 BEV (NEEDLE) IMPLANT
NEEDLE MAYO TAPER (NEEDLE)
NS IRRIG 1000ML POUR BTL (IV SOLUTION) ×3 IMPLANT
PACK SHOULDER (CUSTOM PROCEDURE TRAY) ×3 IMPLANT
PAD ARMBOARD 7.5X6 YLW CONV (MISCELLANEOUS) ×6 IMPLANT
PASSER SUT SWANSON 36MM LOOP (INSTRUMENTS) IMPLANT
PIN POSITIONING THREADED (PIN) ×3 IMPLANT
PLATE LOCKING 9 HOLE (Plate) ×3 IMPLANT
PRESSURIZER FEMORAL UNIV (MISCELLANEOUS) IMPLANT
PUTTY BONE DBX 5CC MIX (Putty) ×3 IMPLANT
SCREW CORTEX ST 4.5X14 (Screw) ×3 IMPLANT
SCREW CORTEX ST 4.5X24 (Screw) ×3 IMPLANT
SCREW CORTEX ST 4.5X26 (Screw) ×6 IMPLANT
SCREW CORTEX ST 4.5X28 (Screw) ×3 IMPLANT
SCREW LOCKING 5.0MM 14MM (Screw) ×9 IMPLANT
SET HNDPC FAN SPRY TIP SCT (DISPOSABLE) IMPLANT
SLING ARM FOAM STRAP LRG (SOFTGOODS) ×3 IMPLANT
SLING ARM LRG ADULT FOAM STRAP (SOFTGOODS) IMPLANT
SLING ARM XL FOAM STRAP (SOFTGOODS) ×3 IMPLANT
SPONGE LAP 18X18 X RAY DECT (DISPOSABLE) ×3 IMPLANT
SPONGE LAP 4X18 X RAY DECT (DISPOSABLE) IMPLANT
STRIP CLOSURE SKIN 1/2X4 (GAUZE/BANDAGES/DRESSINGS) ×2 IMPLANT
SUCTION FRAZIER TIP 10 FR DISP (SUCTIONS) ×3 IMPLANT
SUT BONE WAX W31G (SUTURE) IMPLANT
SUT FIBERWIRE #2 38 T-5 BLUE (SUTURE) ×6
SUT MNCRL AB 3-0 PS2 18 (SUTURE) ×3 IMPLANT
SUT VIC AB 1 CT1 27 (SUTURE) ×2
SUT VIC AB 1 CT1 27XBRD ANBCTR (SUTURE) ×1 IMPLANT
SUT VIC AB 2-0 CT1 27 (SUTURE) ×6
SUT VIC AB 2-0 CT1 TAPERPNT 27 (SUTURE) ×3 IMPLANT
SUT VIC AB 2-0 SH 27 (SUTURE)
SUT VIC AB 2-0 SH 27X BRD (SUTURE) IMPLANT
SUTURE FIBERWR #2 38 T-5 BLUE (SUTURE) ×2 IMPLANT
SYR 30ML SLIP (SYRINGE) ×3 IMPLANT
SYR CONTROL 10ML LL (SYRINGE) IMPLANT
TOWEL OR 17X24 6PK STRL BLUE (TOWEL DISPOSABLE) ×3 IMPLANT
TOWEL OR 17X26 10 PK STRL BLUE (TOWEL DISPOSABLE) ×3 IMPLANT
TOWER CARTRIDGE SMART MIX (DISPOSABLE) IMPLANT
TRAY FOLEY CATH 16FRSI W/METER (SET/KITS/TRAYS/PACK) IMPLANT
WATER STERILE IRR 1000ML POUR (IV SOLUTION) ×3 IMPLANT

## 2013-06-22 NOTE — Anesthesia Preprocedure Evaluation (Signed)
Anesthesia Evaluation   Patient awake    Reviewed: Allergy & Precautions, H&P , NPO status , Patient's Chart, lab work & pertinent test results  Airway Mallampati: II TM Distance: >3 FB     Dental  (+) Teeth Intact, Dental Advisory Given   Pulmonary  breath sounds clear to auscultation        Cardiovascular hypertension, Rhythm:Regular Rate:Normal     Neuro/Psych    GI/Hepatic   Endo/Other    Renal/GU      Musculoskeletal   Abdominal   Peds  Hematology   Anesthesia Other Findings   Reproductive/Obstetrics                           Anesthesia Physical Anesthesia Plan  ASA: III  Anesthesia Plan: General   Post-op Pain Management:    Induction: Intravenous  Airway Management Planned: Oral ETT  Additional Equipment:   Intra-op Plan:   Post-operative Plan: Extubation in OR  Informed Consent: I have reviewed the patients History and Physical, chart, labs and discussed the procedure including the risks, benefits and alternatives for the proposed anesthesia with the patient or authorized representative who has indicated his/her understanding and acceptance.   Dental advisory given  Plan Discussed with: CRNA and Anesthesiologist  Anesthesia Plan Comments: (S/P L. Total shoulder replacement with peri-prosthetic humerus fracture Hypertension Seizure disorder  Plan GA with interscalene block  Roberts Gaudy, MD)        Anesthesia Quick Evaluation

## 2013-06-22 NOTE — Progress Notes (Signed)
Pt admitted and greeted by myself around 1925.  Pt is alert and oriented and in no pain.  LUE in sling with ice pack and  cdi drsg. Left hand piv intact with ivf infusing.  Husband at bedside. Cup of ice water given to pt and no other needs voiced at  present time.

## 2013-06-22 NOTE — H&P (Signed)
Kristina Phelps    Chief Complaint: LEFT HUMERUS FRACTURE  HPI: The patient is a 78 y.o. female s/p left shoulder reverse arthroplasty approximately 3 years ago, now with recent fall sustaining left periprosthetic humerus fracture.  Past Medical History  Diagnosis Date  . PONV (postoperative nausea and vomiting)   . Hypertension   . Heart murmur   . Hx-TIA (transient ischemic attack)   . Seizures     tuesday week ago had last seizure/ grand mal  . Headache(784.0)   . Arthritis     knee  . Cancer     breast l  . Anxiety     Past Surgical History  Procedure Laterality Date  . Joint replacement Left     shoulder  . Breast surgery Left 95    lumpectomy  . Eye surgery Bilateral     cataracts  . Wrist fracture surgery Bilateral     right has plate in place    History reviewed. No pertinent family history.  Social History:  reports that she has never smoked. She does not have any smokeless tobacco history on file. She reports that she does not drink alcohol. Her drug history is not on file.  Allergies:  Allergies  Allergen Reactions  . Depakote [Divalproex Sodium] Nausea And Vomiting  . Nexium [Esomeprazole Magnesium] Nausea And Vomiting  . Valium [Diazepam]     "Too nervous"    Medications Prior to Admission  Medication Sig Dispense Refill  . aspirin 325 MG tablet Take 650 mg by mouth daily as needed for mild pain or headache.      . losartan-hydrochlorothiazide (HYZAAR) 100-12.5 MG per tablet Take 1 tablet by mouth daily.      . nebivolol (BYSTOLIC) 10 MG tablet Take 10 mg by mouth daily.      Marland Kitchen oxyCODONE-acetaminophen (PERCOCET/ROXICET) 5-325 MG per tablet Take 1 tablet by mouth every 4 (four) hours as needed for severe pain.      . phenytoin (DILANTIN) 100 MG ER capsule Take 100 mg by mouth See admin instructions. Every 8 hours.  Take at 8am and 4pm and 41midnight      . simvastatin (ZOCOR) 20 MG tablet Take 20 mg by mouth at bedtime.         Physical Exam:  left UE protected with sling and splint, intact to light touch left shoulder and hand, grossly n/v intact distally  Vitals  Temp:  [98.2 F (36.8 C)] 98.2 F (36.8 C) (05/12 1054) Pulse Rate:  [77] 77 (05/12 1054) Resp:  [18] 18 (05/12 1054) BP: (147)/(83) 147/83 mmHg (05/12 1054) SpO2:  [97 %] 97 % (05/12 1054) Weight:  [77.111 kg (170 lb)] 77.111 kg (170 lb) (05/12 1054)  Assessment/Plan  Impression: PERIPROSTHETIC  LEFT HUMERUS FRACTURE   Plan of Action: Procedure(s): LEFT SHOULDER OPEN REDUCTION INTERNAL FIXATION POSSIBLE REVISION  LEFT REVERSE SHOULDER ARTHROPLASTY   Kristina Phelps 06/22/2013, 12:59 PM

## 2013-06-22 NOTE — Anesthesia Postprocedure Evaluation (Signed)
  Anesthesia Post-op Note  Patient: Kristina Phelps  Procedure(s) Performed: Procedure(s): LEFT SHOULDER OPEN REDUCTION INTERNAL FIXATION  (Left)  Patient Location: PACU  Anesthesia Type:General and block  Level of Consciousness: awake and alert   Airway and Oxygen Therapy: Patient Spontanous Breathing  Post-op Pain: none  Post-op Assessment: Post-op Vital signs reviewed, Patient's Cardiovascular Status Stable and Respiratory Function Stable  Post-op Vital Signs: Reviewed  Filed Vitals:   06/22/13 1600  BP: 138/60  Pulse: 87  Temp: 36.2 C  Resp: 19    Complications: No apparent anesthesia complications

## 2013-06-22 NOTE — Op Note (Signed)
06/22/2013  3:35 PM  PATIENT:   Kristina Phelps  78 y.o. female  PRE-OPERATIVE DIAGNOSIS: PERIPROSTHETIC  LEFT HUMERUS FRACTURE   POST-OPERATIVE DIAGNOSIS:  same  PROCEDURE:  ORIF  SURGEON:  Bryssa Tones, Metta Clines. M.D.  ASSISTANTS: Shuford pac   ANESTHESIA:   GET + ISB  EBL: 200  SPECIMEN:  none  Drains: none   PATIENT DISPOSITION:  PACU - hemodynamically stable.    PLAN OF CARE: Admit for overnight observation  Dictation# [id number]

## 2013-06-22 NOTE — Progress Notes (Signed)
Pt has voided x 2 with no difficulty. Adream Parzych Candelaria Stagers, RN

## 2013-06-22 NOTE — Transfer of Care (Signed)
Immediate Anesthesia Transfer of Care Note  Patient: Kristina Phelps  Procedure(s) Performed: Procedure(s): LEFT SHOULDER OPEN REDUCTION INTERNAL FIXATION  (Left)  Patient Location: PACU  Anesthesia Type:GA combined with regional for post-op pain  Level of Consciousness: awake, alert  and oriented  Airway & Oxygen Therapy: Patient Spontanous Breathing and Patient connected to nasal cannula oxygen  Post-op Assessment: Report given to PACU RN  Post vital signs: Reviewed and stable  Complications: No apparent anesthesia complications

## 2013-06-23 MED ORDER — METHOCARBAMOL 500 MG PO TABS: 500.0000 mg | ORAL_TABLET | Freq: Three times a day (TID) | ORAL | Status: DC | PRN

## 2013-06-23 MED ORDER — OXYCODONE-ACETAMINOPHEN 5-325 MG PO TABS: 1.0000 | ORAL_TABLET | ORAL | Status: DC | PRN

## 2013-06-23 NOTE — Progress Notes (Signed)
Utilization review completed.  

## 2013-06-23 NOTE — Progress Notes (Signed)
Pt escorted out via wheelchair and volunteer. Manya Silvas, RN

## 2013-06-23 NOTE — Evaluation (Signed)
Occupational Therapy Evaluation Patient Details Name: Kristina Phelps MRN: [id number] DOB: 05-Oct-1935 Today's Date: 06/23/2013    History of Present Illness The patient is a 78 y.o. female s/p left shoulder reverse arthroplasty approximately 3 years ago, now with recent fall sustaining left periprosthetic humerus fracture. Now s/p PROCEDURE:  ORIF   Clinical Impression   Patient evaluated by Occupational Therapy with no further acute OT needs identified. All education has been completed and the patient has no further questions. All education completed.  Husband is able to safely assist her. Lt. UE with significant edema - encouraged pt to keep ice on it, and to perform AROM elbow distally.   Elbow ROM limited ~-15-110* due to pain, but husband able to assist.   See below for any follow-up Occupational Therapy or equipment needs. OT is signing off. Thank you for this referral.     Follow Up Recommendations  Supervision/Assistance - 24 hour;No OT follow up    Equipment Recommendations  None recommended by OT    Recommendations for Other Services       Precautions / Restrictions Precautions Precautions: Shoulder Type of Shoulder Precautions: sling on at all times.  OT for ADLs, sling education, ROM elbow, wrist and hand Shoulder Interventions: Shoulder sling/immobilizer;At all times Precaution Booklet Issued: Yes (comment) Precaution Comments: shoulder instruction handout provided Required Braces or Orthoses: Sling Restrictions Weight Bearing Restrictions: Yes LUE Weight Bearing: Non weight bearing      Mobility Bed Mobility               General bed mobility comments: Pt will sleep in recliner at discharge  Transfers Overall transfer level: Needs assistance Equipment used: 1 person hand held assist Transfers: Sit to/from Stand;Stand Pivot Transfers Sit to Stand: Min assist Stand pivot transfers: Min assist       General transfer comment: assist to move into  standing and for balance     Balance Overall balance assessment: Needs assistance Sitting-balance support: Feet supported;No upper extremity supported Sitting balance-Leahy Scale: Fair     Standing balance support: No upper extremity supported Standing balance-Leahy Scale: Fair                              ADL Overall ADL's : Needs assistance/impaired Eating/Feeding: Set up   Grooming: Set up   Upper Body Bathing: Moderate assistance   Lower Body Bathing: Moderate assistance;Sit to/from stand   Upper Body Dressing : Maximal assistance;Sitting   Lower Body Dressing: Maximal assistance;Sit to/from stand   Toilet Transfer: Minimal assistance;Ambulation;Comfort height toilet   Toileting- Clothing Manipulation and Hygiene: Moderate assistance;Sit to/from stand       Functional mobility during ADLs: Minimal assistance General ADL Comments: Husband has been assisting since fall and will continue     Vision                     Perception     Praxis      Pertinent Vitals/Pain Pt reports pain Lt shoulder 5/10.  Pt premedicated, and ice applied     Hand Dominance Right   Extremity/Trunk Assessment Upper Extremity Assessment Upper Extremity Assessment: RUE deficits/detail RUE Deficits / Details: elbow AAROM ~-15-110*  Limited due to pain.  Moderate edema throughout Rt. UE RUE: Unable to fully assess due to immobilization RUE Coordination: decreased gross motor   Lower Extremity Assessment Lower Extremity Assessment: Defer to PT evaluation       Communication  Communication Communication: No difficulties   Cognition Arousal/Alertness: Awake/alert Behavior During Therapy: WFL for tasks assessed/performed Overall Cognitive Status: Within Functional Limits for tasks assessed                     General Comments       Exercises       Shoulder Instructions Shoulder Instructions Donning/doffing shirt without moving shoulder:  Caregiver independent with task Method for sponge bathing under operated UE: Caregiver independent with task Donning/doffing sling/immobilizer: Caregiver independent with task Correct positioning of sling/immobilizer: Caregiver independent with task Pendulum exercises (written home exercise program):  (n/a) ROM for elbow, wrist and digits of operated UE: Moderate assistance;Caregiver independent with task Sling wearing schedule (on at all times/off for ADL's): Independent;Caregiver independent with task Proper positioning of operated UE when showering: Supervision/safety;Caregiver independent with task Positioning of UE while sleeping: Modified independent;Caregiver independent with task    Home Living Family/patient expects to be discharged to:: Private residence Living Arrangements: Spouse/significant other Available Help at Discharge: Family;Available 24 hours/day Type of Home: House Home Access: Stairs to enter [employer] of Steps: 3   Home Layout: One level     Bathroom Shower/Tub: Occupational psychologist: Handicapped height Bathroom Accessibility: Yes How Accessible: Accessible via walker Home Equipment: Walker - 4 wheels;Grab bars - tub/shower;Cane - quad;Cane - single point          Prior Functioning/Environment Level of Independence: Needs assistance  Gait / Transfers Assistance Needed: ambulated independently without AD ADL's / Homemaking Assistance Needed: husband has been assisting her with ADLs since fall last week.  Prior to that she was fully independent.  SHe does not drive   Comments: Pt reports one fall in past 3 years.     OT Diagnosis:     OT Problem List:     OT Treatment/Interventions:      OT Goals(Current goals can be found in the care plan section)    OT Frequency:     Barriers to D/C:            Co-evaluation              End of Session Equipment Utilized During Treatment: Other (comment) (sling)  Activity  Tolerance: Patient tolerated treatment well Patient left: in chair;with call bell/phone within reach;with family/visitor present   Time: 4818-5631 OT Time Calculation (min): 43 min Charges:  OT General Charges $OT Visit: 1 Procedure OT Evaluation $Initial OT Evaluation Tier I: 1 Procedure OT Treatments $Self Care/Home Management : 23-37 mins G-Codes:    Rendy Lazard M Vishal Sandlin 07-07-13, 10:05 AM

## 2013-06-23 NOTE — Discharge Instructions (Signed)
OK to allow arm to dangle by side and move elbow,wrist and hand No weight bearing to left arm OK to shower in 5 days May change dressing and reapply light gauze if needed. If the dressing is still clean and dry you may leave it in place until you shower.

## 2013-06-23 NOTE — Plan of Care (Signed)
Problem: Phase I Progression Outcomes Goal: OOB as tolerated unless otherwise ordered Outcome: Completed/Met Date Met:  06/23/13 Ambulating to bathroom with contact guard assist. OOB to chair for breakfast.

## 2013-06-23 NOTE — Progress Notes (Signed)
Pt discharged per MD. Instructions provided to both patient and spouse. Patient requested that spouse sign instructions. NSL removed with catheter intact. Manya Silvas, RN

## 2013-06-23 NOTE — Op Note (Signed)
Kristina Phelps, Kristina Phelps NO.:  [id number]  MEDICAL RECORD NO.:  [id number]  LOCATION:  6E20C                        FACILITY:  [facility]  PHYSICIAN:  Metta Clines. Yvette Loveless, M.D.  DATE OF BIRTH:  Feb 20, 1935  DATE OF PROCEDURE:  06/22/2013 DATE OF DISCHARGE:                              OPERATIVE REPORT   PREOPERATIVE DIAGNOSIS:  Displaced left periprosthetic humeral shaft fracture.  POSTOPERATIVE DIAGNOSIS:  Displaced left periprosthetic humeral shaft fracture.  PROCEDURE:  Open reduction and internal fixation of left periprosthetic humeral shaft fracture as well as the application of allograft bone graft including bone morphogenetic protein.  SURGEON:  Metta Clines. Lior Hoen, M.D.  Terrence DupontOlivia Mackie A. Shuford, PA-C.  ANESTHESIA:  General endotracheal as well as an interscalene block.  ESTIMATED BLOOD LOSS:  200 mL.  DRAINS:  None.  HISTORY:  Kristina Phelps is a 78 year old female who approximately 3 years ago had a left shoulder reverse arthroplasty that I performed and was doing well until she unfortunately fell back on May 6th, 6 days ago. She was seen at an outside hospital where x-rays were obtained showing a severely displaced short oblique fracture of the distal tip of her prosthesis.  She is now brought to the operating room for planned open reduction and internal fixation.  Preoperatively, I counseled Kristina Phelps regarding treatment options and risks versus benefits thereof.  Possible surgical complications were all reviewed including potential for bleeding, infection, neurovascular injury, malunion, nonunion, loss of fixation, and possible need for additional surgery, and potential for anesthetic complications.  She understands and accepts and agrees with our planned procedure.  PROCEDURE IN DETAIL:  After undergoing routine preop evaluation, the patient received prophylactic antibiotics and an interscalene block was established in the holding area by the  Anesthesia Department.  Placed supine on the operating table, underwent smooth induction of a general endotracheal anesthesia.  The left upper extremity was then sterilely prepped and draped in standard fashion.  Time-out was called.  An anterior approach to the left humerus was made beginning at the distal aspect of the previous deltopectoral incision and extending anteriorly over the midline of the upper arm to just above the elbow, total length approximately 20 cm.  Skin flaps were elevated and electrocautery was used for hemostasis.  The dissection was carried deeply into the proximal aspect of the wound, I identified the cephalic vein, and this was then retracted laterally with the deltoid.  The pec major tendon was then identified.  We developed deltopectoral interval distally, reflected the deltoid insertion posterolaterally.  I identified the long head of the biceps and then retracted the biceps medially and then beneath this identified the brachialis and this was divided at the junction between the middle and lateral thirds in a longitudinal fashion from proximal to distal, and then subperiosteal dissection was then used to expose the anterior shaft of the humerus and region surrounding the fracture site and well proximal and distal.  We then removed the interposed soft tissue at the fracture site.  There was a small portion of bone cement in the canal of the distal shaft, which was easily removed through the distal cement plug, however, was left in place.  We  just seated the tip of the prosthesis into the proximal segment, and this appeared to be well fixed.  At this point, the wound was copiously irrigated.  We gained appropriate exposure such that we could provisionally placed a 9-hole 4.5 narrow Synthes plate over the anterolateral cortex of the humerus and obtained temporary reduction with bone clamps and using fluoroscopic imaging confirmed that the overall alignment was to  our satisfaction.  Once this was completed, I then provisionally fixed the plate with a single cortical screw distally and then an unicortical screw proximally.  Once we were pleased with the overall position of the construct, I then passed a cerclage wire behind the humerus carefully maintaining a subperiosteal position and then this was appropriately tensioned to 40 foot pounds and then crimped.  Once this provisional fixation was completed and we were overall pleased with the position on fluoroscopic imaging, we went ahead and placed 3 unicortical locking screws into the proximal segment plus an additional cerclage wire, then the distal segment was transfixed with a series of 4 cortical screws, one of which I used in a compression mode to gain compression of the fracture site.  Our final construct was much to our satisfaction.  We then copiously irrigated and obtained hemostasis.  We then obtained 20 mL of corticocancellous chips and also with several mL of bone morphogenic protein which we mixed into a slurry and packed this around the fracture site circumferentially.  We then closed the deep fascial layer reapproximating the brachialis about the humerus distally and then reapproximated the deltopectoral interval proximally.  Deep fascia was then loosely reapproximated, this was performed with #1 Vicryl suture.  2-0 Vicryl used for the subcu, deep, and superficial layers and intracuticular 3-0 Monocryl for the skin followed by Dermabond and an Aquacel dressing.  Left arm was then placed in a sling and the patient was subsequently awakened, extubated, and taken to the recovery room in stable condition.  Jenetta Loges, PA-C was used as an Environmental consultant throughout this case, essential for help with positioning of the patient, positioning extremity, manipulation of the fracture site, assistance with holding reduction, tissue manipulation, wound closure, and intraoperative decision  making.     Metta Clines. Aarya Quebedeaux, M.D.     KMS/MEDQ  D:  06/22/2013  T:  06/23/2013  Job:  [id number]

## 2013-06-23 NOTE — Discharge Summary (Signed)
PATIENT ID:      Kristina Phelps  MRN:     [id number] DOB/AGE:    [birth year] / 78 y.o.     DISCHARGE SUMMARY  ADMISSION DATE:    06/22/2013 DISCHARGE DATE:    ADMISSION DIAGNOSIS: LEFT HUMERUS FRACTURE  Past Medical History  Diagnosis Date  . PONV (postoperative nausea and vomiting)   . Hypertension   . Heart murmur   . Hx-TIA (transient ischemic attack)   . Seizures     tuesday week ago had last seizure/ grand mal  . Headache(784.0)   . Arthritis     knee  . Cancer     breast l  . Anxiety     DISCHARGE DIAGNOSIS:   Active Problems:   Fracture of humerus   PROCEDURE: Procedure(s): LEFT SHOULDER OPEN REDUCTION INTERNAL FIXATION  on 06/22/2013  CONSULTS:     HISTORY:  See H&P in chart.  HOSPITAL COURSE:  EDLYN ROSENBURG is a 78 y.o. admitted on 06/22/2013 with a chief complaint of left arm pain following a mechanical fall, and found to have a diagnosis of LEFT HUMERUS FRACTURE .  They were brought to the operating room on 06/22/2013 and underwent Procedure(s): LEFT SHOULDER OPEN REDUCTION INTERNAL FIXATION .    They were given perioperative antibiotics: Anti-infectives   Start     Dose/Rate Route Frequency Ordered Stop   06/22/13 2000  ceFAZolin (ANCEF) IVPB 2 g/50 mL premix     2 g 100 mL/hr over 30 Minutes Intravenous Every 6 hours 06/22/13 1931 06/23/13 0834   06/22/13 0600  ceFAZolin (ANCEF) IVPB 2 g/50 mL premix     2 g 100 mL/hr over 30 Minutes Intravenous On call to O.R. 06/21/13 1412 06/22/13 1328    .  Patient underwent the above named procedure and tolerated it well. The following day they were hemodynamically stable and pain was controlled on oral analgesics. They were neurovascularly intact to the operative extremity. OT was ordered and worked with patient per protocol. They were medically and orthopaedically stable for discharge on day1.    DIAGNOSTIC STUDIES:  RECENT RADIOGRAPHIC STUDIES :  Dg Chest 2 View  06/22/2013   CLINICAL DATA:   Hypertension; preoperative evaluation  EXAM: CHEST  2 VIEW  COMPARISON:  November 06, 2011  FINDINGS: There is mild scarring in the left base. Elsewhere lungs are clear. Heart size and pulmonary vascularity are normal. There are multiple calcified lymph nodes consistent with prior granulomatous disease. No adenopathy is appreciable.  There are surgical clips in the left axillary region. There is a total shoulder replacement on the left. There old healed rib fractures on the left. There is mid thoracic dextroscoliosis with thoracolumbar levoscoliosis.  IMPRESSION: No edema or consolidation. Mild scarring left base. Scoliosis again noted.   Electronically Signed   By: Lowella Grip M.D.   On: 06/22/2013 11:28   Dg Humerus Left  06/22/2013   CLINICAL DATA:  Fracture; Surgery  EXAM: LEFT HUMERUS - 2+ VIEW; DG C-ARM 61-120 MIN  COMPARISON:  06/16/2013  FINDINGS: Four intraoperative fluoroscopic spot images document plate and screw fixation of the midshaft humeral fracture in near anatomic alignment. Two proximal cerclage wires are noted. Components of left shoulder arthroplasty project in stable position.  IMPRESSION: Internal fixation of humerus fracture   Electronically Signed   By: Arne Cleveland M.D.   On: 06/22/2013 16:47   Dg C-arm 61-120 Min  06/22/2013   CLINICAL DATA:  Fracture; Surgery  EXAM: LEFT HUMERUS -  2+ VIEW; DG C-ARM 61-120 MIN  COMPARISON:  06/16/2013  FINDINGS: Four intraoperative fluoroscopic spot images document plate and screw fixation of the midshaft humeral fracture in near anatomic alignment. Two proximal cerclage wires are noted. Components of left shoulder arthroplasty project in stable position.  IMPRESSION: Internal fixation of humerus fracture   Electronically Signed   By: Arne Cleveland M.D.   On: 06/22/2013 16:47    RECENT VITAL SIGNS:  Patient Vitals for the past 24 hrs:  BP Temp Temp src Pulse Resp SpO2 Weight  06/23/13 1013 110/64 mmHg 99.2 F (37.3 C) Oral 94 16 92  % -  06/23/13 0601 100/64 mmHg 98.6 F (37 C) Oral 90 16 95 % -  06/22/13 2100 106/44 mmHg 98.5 F (36.9 C) - 87 18 99 % -  06/22/13 2039 110/40 mmHg 98.9 F (37.2 C) - 83 18 98 % -  06/22/13 2023 - - - - - - 78.9 kg (173 lb 15.1 oz)  06/22/13 1900 105/42 mmHg 98.4 F (36.9 C) - 81 16 95 % -  06/22/13 1745 136/81 mmHg - - 80 16 93 % -  06/22/13 1730 128/67 mmHg - - 75 19 95 % -  06/22/13 1715 - - - 71 19 92 % -  06/22/13 1700 127/67 mmHg - - 73 19 94 % -  06/22/13 1645 140/66 mmHg - - 72 18 94 % -  06/22/13 1630 140/64 mmHg - - 79 17 95 % -  06/22/13 1615 123/58 mmHg - - 77 17 95 % -  06/22/13 1600 138/60 mmHg 97.2 F (36.2 C) - 87 19 94 % -  06/22/13 1305 125/45 mmHg - - 86 22 99 % -  06/22/13 1300 137/45 mmHg - - 78 28 100 % -  06/22/13 1255 168/59 mmHg - - 80 18 95 % -  .  RECENT EKG RESULTS:    Orders placed during the hospital encounter of 06/22/13  . EKG 12-LEAD  . EKG 12-LEAD    DISCHARGE INSTRUCTIONS:    DISCHARGE MEDICATIONS:     Medication List    TAKE these medications       methocarbamol 500 MG tablet  Commonly known as:  ROBAXIN  Take 1 tablet (500 mg total) by mouth 3 (three) times daily as needed for muscle spasms.      ASK your doctor about these medications       aspirin 325 MG tablet  Take 650 mg by mouth daily as needed for mild pain or headache.     losartan-hydrochlorothiazide 100-12.5 MG per tablet  Commonly known as:  HYZAAR  Take 1 tablet by mouth daily.     nebivolol 10 MG tablet  Commonly known as:  BYSTOLIC  Take 10 mg by mouth daily.     oxyCODONE-acetaminophen 5-325 MG per tablet  Commonly known as:  PERCOCET/ROXICET  Take 1 tablet by mouth every 4 (four) hours as needed for severe pain.  Ask about: Which instructions should I use?     oxyCODONE-acetaminophen 5-325 MG per tablet  Commonly known as:  PERCOCET  Take 1-2 tablets by mouth every 4 (four) hours as needed.  Ask about: Which instructions should I use?      phenytoin 100 MG ER capsule  Commonly known as:  DILANTIN  Take 100 mg by mouth See admin instructions. Every 8 hours.  Take at 8am and 4pm and 5midnight     simvastatin 20 MG tablet  Commonly known as:  ZOCOR  Take 20 mg by mouth at bedtime.        FOLLOW UP VISIT:       Follow-up Information   Follow up with Marin Shutter, MD.   Specialty:  Orthopedic Surgery   Contact information:   [address] [place] [address] [place] [address] [phone]       DISCHARGE TO: home  DISPOSITION: good  DISCHARGE CONDITION:  [place] for Dr. Justice Britain 06/23/2013, 12:47 PM

## 2013-06-23 NOTE — Plan of Care (Signed)
Problem: Phase I Progression Outcomes Goal: Pain controlled with appropriate interventions Outcome: Progressing 1 percocet for pain. Continue to monitor.

## 2013-06-24 NOTE — Care Management Note (Signed)
CARE MANAGEMENT NOTE 06/24/2013  Patient:  Kristina Phelps, Kristina Phelps   Account Number:  [id number]  Date Initiated:  06/23/2013  Documentation initiated by:  Anessa Charley  Subjective/Objective Assessment:   CM referral for [facility] needs.     Action/Plan:   06/23/13 Met with pt and husband, they do not feel that they need [facility] services, she has had previous shoulder surgery and is familiar with post op recovery.   Anticipated DC Date:  06/25/2013   Anticipated DC Plan:  HOME/SELF CARE         Choice offered to / List presented to:             Status of service:  Completed, signed off Medicare Important Message given?  NA - LOS <3 / Initial given by admissions (If response is "NO", the following Medicare IM given date fields will be blank) Date Medicare IM given:   Date Additional Medicare IM given:    Discharge Disposition:  HOME/SELF CARE  Per UR Regulation:    If discussed at Long Length of Stay Meetings, dates discussed:    Comments:

## 2013-06-29 ENCOUNTER — Encounter (HOSPITAL_COMMUNITY): Payer: Self-pay | Admitting: Orthopedic Surgery

## 2013-07-01 ENCOUNTER — Encounter (HOSPITAL_COMMUNITY): Payer: Self-pay | Admitting: Orthopedic Surgery

## 2013-07-07 NOTE — Addendum Note (Signed)
Addendum created 07/07/13 1309 by Roberts Gaudy, MD   Modules edited: Anesthesia Blocks and Procedures, Clinical Notes   Clinical Notes:  File: [id number]

## 2013-07-07 NOTE — Anesthesia Procedure Notes (Signed)
Anesthesia Regional Block:  Interscalene brachial plexus block  Pre-Anesthetic Checklist: ,, timeout performed, Correct Patient, Correct Site, Correct Laterality, Correct Procedure, Correct Position, site marked, Risks and benefits discussed,  Surgical consent,  Pre-op evaluation,  At surgeon's request and post-op pain management  Laterality: Left  Prep: chloraprep       Needles:   Needle Type: Echogenic Stimulator Needle     Needle Length: 9cm 9 cm Needle Gauge: 22 and 22 G    Additional Needles:  Procedures: ultrasound guided (picture in chart) Interscalene brachial plexus block Narrative:  Start time: 06/22/2013 1:20 PM End time: 06/22/2013 1:25 PM  Performed by: Personally   Additional Notes: 25 cc 0.5% marcaine with 1:200 Epi injected easily

## 2013-08-05 ENCOUNTER — Ambulatory Visit: Payer: Medicare Other | Attending: Orthopedic Surgery | Admitting: Physical Therapy

## 2013-08-05 DIAGNOSIS — IMO0001 Reserved for inherently not codable concepts without codable children: Secondary | ICD-10-CM | POA: Insufficient documentation

## 2013-08-05 DIAGNOSIS — I890 Lymphedema, not elsewhere classified: Secondary | ICD-10-CM | POA: Diagnosis not present

## 2013-08-11 ENCOUNTER — Ambulatory Visit: Payer: Medicare Other | Attending: Orthopedic Surgery | Admitting: Physical Therapy

## 2013-08-11 DIAGNOSIS — IMO0001 Reserved for inherently not codable concepts without codable children: Secondary | ICD-10-CM | POA: Diagnosis present

## 2013-08-11 DIAGNOSIS — I890 Lymphedema, not elsewhere classified: Secondary | ICD-10-CM | POA: Diagnosis not present

## 2013-08-16 ENCOUNTER — Ambulatory Visit: Payer: Medicare Other | Admitting: Physical Therapy

## 2013-08-16 DIAGNOSIS — IMO0001 Reserved for inherently not codable concepts without codable children: Secondary | ICD-10-CM | POA: Diagnosis not present

## 2013-08-18 ENCOUNTER — Ambulatory Visit: Payer: Medicare Other | Admitting: Physical Therapy

## 2013-08-18 DIAGNOSIS — IMO0001 Reserved for inherently not codable concepts without codable children: Secondary | ICD-10-CM | POA: Diagnosis not present

## 2013-08-20 ENCOUNTER — Ambulatory Visit: Payer: Medicare Other | Admitting: Physical Therapy

## 2013-08-20 DIAGNOSIS — IMO0001 Reserved for inherently not codable concepts without codable children: Secondary | ICD-10-CM | POA: Diagnosis not present

## 2013-08-24 ENCOUNTER — Ambulatory Visit: Payer: Medicare Other | Admitting: Physical Therapy

## 2013-08-24 DIAGNOSIS — IMO0001 Reserved for inherently not codable concepts without codable children: Secondary | ICD-10-CM | POA: Diagnosis not present

## 2013-08-25 ENCOUNTER — Ambulatory Visit: Payer: Medicare Other | Admitting: Physical Therapy

## 2013-08-25 DIAGNOSIS — IMO0001 Reserved for inherently not codable concepts without codable children: Secondary | ICD-10-CM | POA: Diagnosis not present

## 2013-08-27 ENCOUNTER — Ambulatory Visit: Payer: Medicare Other | Admitting: Physical Therapy

## 2013-08-27 DIAGNOSIS — IMO0001 Reserved for inherently not codable concepts without codable children: Secondary | ICD-10-CM | POA: Diagnosis not present

## 2013-08-31 ENCOUNTER — Ambulatory Visit: Payer: Medicare Other

## 2013-08-31 DIAGNOSIS — IMO0001 Reserved for inherently not codable concepts without codable children: Secondary | ICD-10-CM | POA: Diagnosis not present

## 2013-09-02 ENCOUNTER — Ambulatory Visit: Payer: Medicare Other

## 2013-09-02 DIAGNOSIS — IMO0001 Reserved for inherently not codable concepts without codable children: Secondary | ICD-10-CM | POA: Diagnosis not present

## 2013-09-06 ENCOUNTER — Ambulatory Visit: Payer: Medicare Other | Admitting: Physical Therapy

## 2013-09-06 DIAGNOSIS — IMO0001 Reserved for inherently not codable concepts without codable children: Secondary | ICD-10-CM | POA: Diagnosis not present

## 2013-09-10 ENCOUNTER — Ambulatory Visit: Payer: Medicare Other | Admitting: Physical Therapy

## 2013-09-10 DIAGNOSIS — IMO0001 Reserved for inherently not codable concepts without codable children: Secondary | ICD-10-CM | POA: Diagnosis not present

## 2014-02-11 HISTORY — PX: CLOSED REDUCTION ELBOW FRACTURE: SHX930

## 2014-10-13 DIAGNOSIS — Z8673 Personal history of transient ischemic attack (TIA), and cerebral infarction without residual deficits: Secondary | ICD-10-CM

## 2014-10-13 HISTORY — DX: Personal history of transient ischemic attack (TIA), and cerebral infarction without residual deficits: Z86.73

## 2014-11-12 HISTORY — PX: CYST EXCISION: SHX5701

## 2014-12-19 ENCOUNTER — Encounter: Payer: Self-pay | Admitting: Gynecologic Oncology

## 2014-12-19 ENCOUNTER — Ambulatory Visit: Payer: Medicare Other | Attending: Gynecologic Oncology | Admitting: Gynecologic Oncology

## 2014-12-19 ENCOUNTER — Other Ambulatory Visit (HOSPITAL_BASED_OUTPATIENT_CLINIC_OR_DEPARTMENT_OTHER): Payer: Medicare Other

## 2014-12-19 VITALS — BP 142/80 | HR 89 | Temp 97.5°F | Resp 18 | Ht 65.0 in | Wt 149.2 lb

## 2014-12-19 DIAGNOSIS — I82401 Acute embolism and thrombosis of unspecified deep veins of right lower extremity: Secondary | ICD-10-CM

## 2014-12-19 DIAGNOSIS — I890 Lymphedema, not elsewhere classified: Secondary | ICD-10-CM | POA: Insufficient documentation

## 2014-12-19 DIAGNOSIS — R1900 Intra-abdominal and pelvic swelling, mass and lump, unspecified site: Secondary | ICD-10-CM

## 2014-12-19 DIAGNOSIS — R1909 Other intra-abdominal and pelvic swelling, mass and lump: Secondary | ICD-10-CM | POA: Diagnosis present

## 2014-12-19 DIAGNOSIS — Z86718 Personal history of other venous thrombosis and embolism: Secondary | ICD-10-CM

## 2014-12-19 DIAGNOSIS — G40409 Other generalized epilepsy and epileptic syndromes, not intractable, without status epilepticus: Secondary | ICD-10-CM | POA: Diagnosis not present

## 2014-12-19 DIAGNOSIS — F0280 Dementia in other diseases classified elsewhere without behavioral disturbance: Secondary | ICD-10-CM | POA: Insufficient documentation

## 2014-12-19 DIAGNOSIS — I82409 Acute embolism and thrombosis of unspecified deep veins of unspecified lower extremity: Secondary | ICD-10-CM | POA: Insufficient documentation

## 2014-12-19 DIAGNOSIS — G40909 Epilepsy, unspecified, not intractable, without status epilepticus: Secondary | ICD-10-CM | POA: Insufficient documentation

## 2014-12-19 LAB — BASIC METABOLIC PANEL (CC13)
ANION GAP: 11 meq/L (ref 3–11)
BUN: 19 mg/dL (ref 7.0–26.0)
CO2: 23 mEq/L (ref 22–29)
Calcium: 10.2 mg/dL (ref 8.4–10.4)
Chloride: 103 mEq/L (ref 98–109)
Creatinine: 0.7 mg/dL (ref 0.6–1.1)
EGFR: 81 mL/min/{1.73_m2} — AB (ref >90)
Glucose: 92 mg/dl (ref 70–140)
POTASSIUM: 4.5 meq/L (ref 3.5–5.1)
Sodium: 136 mEq/L (ref 136–145)

## 2014-12-19 NOTE — Patient Instructions (Addendum)
We will arrange for you to have an IVC filter placed prior to surgery to prevent the blood clot in your leg from moving to your heart and lungs during surgery.  STOP TAKING XARELTO FIVE DAYS BEFORE.  You will be scheduled for an exploratory laparotomy, bilateral salpingo-oophorectomy, possible hysterectomy, possible bowel resection.                Preparing for your Surgery  Plan for surgery on November 29 with Dr. Everitt Amber at [facility].  The date may change if we need further testing, procedures, etc.  Pre-operative Testing -You will receive a phone call from presurgical testing at [facility] to arrange for a pre-operative testing appointment before your surgery.  This appointment normally occurs one to two weeks before your scheduled surgery.   -Bring your insurance card, copy of an advanced directive if applicable, medication list  -At that visit, you will be asked to sign a consent for a possible blood transfusion in case a transfusion becomes necessary during surgery.  The need for a blood transfusion is rare but having consent is a necessary part of your care.     Day Before Surgery at [facility] will be asked to take in only clear liquids the day before surgery.  Examples of clear liquids include broths, jello, and clear juices.  Avoid carbonated beverages.  You will be advised to have nothing to eat or drink after midnight the evening before.    Your role in recovery Your role is to become active as soon as directed by your doctor, while still giving yourself time to heal.  Rest when you feel tired. You will be asked to do the following in order to speed your recovery:  - Cough and breathe deeply. This helps toclear and expand your lungs and can prevent pneumonia. You may be given a spirometer to practice deep breathing. A staff member will show you how to use the spirometer. - Do mild physical activity. Walking or moving your legs help your circulation and body  functions return to normal. A staff member will help you when you try to walk and will provide you with simple exercises. Do not try to get up or walk alone the first time. - Actively manage your pain. Managing your pain lets you move in comfort. We will ask you to rate your pain on a scale of zero to 10. It is your responsibility to tell your doctor or nurse where and how much you hurt so your pain can be treated.  Special Considerations -If you are diabetic, you may be placed on insulin after surgery to have closer control over your blood sugars to promote healing and recovery.  This does not mean that you will be discharged on insulin.  If applicable, your oral antidiabetics will be resumed when you are tolerating a solid diet.  -Your final pathology results from surgery should be available by the Friday after surgery and the results will be relayed to you when available.  Blood Transfusion Information WHAT IS A BLOOD TRANSFUSION? A transfusion is the replacement of blood or some of its parts. Blood is made up of multiple cells which provide different functions.  Red blood cells carry oxygen and are used for blood loss replacement.  White blood cells fight against infection.  Platelets control bleeding.  Plasma helps clot blood.  Other blood products are available for specialized needs, such as hemophilia or other clotting disorders. BEFORE THE TRANSFUSION  Who gives blood for transfusions?   You may be able to donate blood to be used at a later date on yourself (autologous donation).  Relatives can be asked to donate blood. This is generally not any safer than if you have received blood from a stranger. The same precautions are taken to ensure safety when a relative's blood is donated.  Healthy volunteers who are fully evaluated to make sure their blood is safe. This is blood bank blood. Transfusion therapy is the safest it has ever been in the practice of medicine. Before blood is  taken from a donor, a complete history is taken to make sure that person has no history of diseases nor engages in risky social behavior (examples are intravenous drug use or sexual activity with multiple partners). The donor's travel history is screened to minimize risk of transmitting infections, such as malaria. The donated blood is tested for signs of infectious diseases, such as HIV and hepatitis. The blood is then tested to be sure it is compatible with you in order to minimize the chance of a transfusion reaction. If you or a relative donates blood, this is often done in anticipation of surgery and is not appropriate for emergency situations. It takes many days to process the donated blood. RISKS AND COMPLICATIONS Although transfusion therapy is very safe and saves many lives, the main dangers of transfusion include:   Getting an infectious disease.  Developing a transfusion reaction. This is an allergic reaction to something in the blood you were given. Every precaution is taken to prevent this. The decision to have a blood transfusion has been considered carefully by your caregiver before blood is given. Blood is not given unless the benefits outweigh the risks.

## 2014-12-19 NOTE — Progress Notes (Signed)
Consult Note: Gyn-Onc  Consult was requested by Dr. Elgie Congo for the evaluation of Kristina Phelps 79 y.o. female with a large pelvic  CC:  Chief Complaint  Patient presents with  . pelvic mass    New consultation    Assessment/Plan:  Ms. CHAKITA MCGRAW  is a 79 y.o.  year old with a large (20cm) complex, mostly solid left abdomino-pelvic mass in the setting of poor performance status (ECOG 2-3) and dementia, an acute DVT.  I performed a history, physical examination, and personally reviewed the patient's imaging films including the CT scan of the abdomen and pelvis from 12/01/14 from [facility]. The images show a large (20cm) irregular, mostly solid mass from the pelvis (likely left ovary or uterus) extending into the abdomen. It is causing left hydroureter. There is no ascites and no other signs of metastatic ovarian cancer.  I do not know definitively if this is a malignancy, and recommend surgery to confirm its etiology. However, if this is a malignancy, it is unlikely she will be a good candidate for aggressive adjuvant therapy due to her poor underlying health and performance status.  She is at a high surgical risk given her anticoagulation status from her DVT and her poor performance status.  She will need preoperative clearance and optimization from her PCP, Dr Burnett Sheng. She will need to hold her Xarelto held 5 days preop. She will need placement of an IVC filter by IR preop. She will need facilitation of placement to a nursing home postop as she is clearly not doing well at home independently with her husband, and will need care and assistance more than he can offer postop.  I discussed with [place] and her husband that surgery will include an exploratory laparotomy, bilateral salpingo-oophorectomy, debulking of pelvic mass, possible hysterectomy, possible bowel resection. I discussed with him that this is a major abdominal surgery with high risk particularly for [facility]. I  discussed that this mass is causing compression of her GI and GU system and therefore reconstruction of these organs may be necessary as part of surgery. I discussed that she is at particularly high risk for bleeding complication perioperatively and for needing a blood transfusion. We will type and cross her for 2 units perioperatively. I discussed that she is at high-risk for prolonged hospitalization, reoperation, and perioperative death.  I discussed that she will certainly need to have placement postoperatively in a nursing home or rehab due to her poor performance status.  Her niece has POA but the patient can legally sign consent.  HPI: Kristina Phelps is a 79 year old G0 who is seen in consultation at the request of Dr Elgie Congo for a large abdomino-pelvic mass. The patient, who lives in [place], has a history of a DVT in the right lower extremity and a proximally September 2016. This was managed with prescription of Xarelto. She then developed symptoms of urinary leakage with urge incontinence. She was evaluated at [facility] on 12/01/2014 for symptoms of abdominal swelling and a palpable mass. A CT scan of the abdomen and pelvis was performed on that day and revealed a 4 mm nodule in the right middle lobe of the lung. Mild left hydroureter ureter nephrosis. A large heterogeneously enhancing pelvic mass measuring 20.7 x 14.7 x 14 cm exerting mass effect on all adjacent structures, displacing the urinary bladder, displacing the rectum posteriorly, displacing the sigmoid colon cephalad. Was unclear if this originated from the left ovary or uterus. It was felt to be  displacing adjacent structures rather than invading adjacent structures. There was no ascites visible, no carcinomatosis, no omental caking identified. The patient had a left ureteral stent placed by urology which was later expelled spontaneously approximate 1 week later.  During these recent hospitalizations the patient experienced a  fall. She states that she hit her head and CT scans were done with no abnormalities identified. Since that time she's had difficulty ambulating and the patient is effectively bedbound. She lives with her husband who is also elderly. They have no immediate family support. She is unable to ambulate independently.  She has personal medical history of hyperlipidemia, and epilepsy. She does not carry a formal diagnosis but appears to have issues with dementia where she admits to forgetting things easily. She cannot provide a medical history for clinicians. She has a remote history of breast cancer 1995 treated with surgery and radiation from which she has long-term left upper extremity lymphedema. Dr. Burnett Sheng is her primary care doctor in [place].  Current Meds:  Outpatient Encounter Prescriptions as of 12/19/2014  Medication Sig  . lacosamide (VIMPAT) 200 MG TABS tablet Take 200 mg by mouth 2 (two) times daily.  Marland Kitchen losartan-hydrochlorothiazide (HYZAAR) 100-12.5 MG per tablet Take 1 tablet by mouth daily.  . nebivolol (BYSTOLIC) 10 MG tablet Take 10 mg by mouth daily.  . rivaroxaban (XARELTO) 20 MG TABS tablet Take 20 mg by mouth daily with supper.  . simvastatin (ZOCOR) 20 MG tablet Take 20 mg by mouth at bedtime.  . [DISCONTINUED] methocarbamol (ROBAXIN) 500 MG tablet Take 1 tablet (500 mg total) by mouth 3 (three) times daily as needed for muscle spasms.  Marland Kitchen aspirin 325 MG tablet Take 650 mg by mouth daily as needed for mild pain or headache.  . methocarbamol (ROBAXIN) 500 MG tablet Take 1 tablet (500 mg total) by mouth 3 (three) times daily as needed for muscle spasms.  . [DISCONTINUED] oxyCODONE-acetaminophen (PERCOCET) 5-325 MG per tablet Take 1-2 tablets by mouth every 4 (four) hours as needed.  . [DISCONTINUED] oxyCODONE-acetaminophen (PERCOCET) 5-325 MG per tablet Take 1-2 tablets by mouth every 4 (four) hours as needed.  . [DISCONTINUED] oxyCODONE-acetaminophen (PERCOCET/ROXICET) 5-325 MG per  tablet Take 1 tablet by mouth every 4 (four) hours as needed for severe pain.  . [DISCONTINUED] phenytoin (DILANTIN) 100 MG ER capsule Take 100 mg by mouth See admin instructions. Every 8 hours.  Take at 8am and 4pm and 27midnight   No facility-administered encounter medications on file as of 12/19/2014.    Allergy:  Allergies  Allergen Reactions  . Depakote [Divalproex Sodium] Nausea And Vomiting  . Nexium [Esomeprazole Magnesium] Nausea And Vomiting  . Valium [Diazepam]     "Too nervous"    Social Hx:   Social History   Social History  . Marital Status: Married    Spouse Name: N/A  . Number of Children: N/A  . Years of Education: N/A   Occupational History  . Not on file.   Social History Main Topics  . Smoking status: Never Smoker   . Smokeless tobacco: Not on file  . Alcohol Use: No  . Drug Use: No  . Sexual Activity: Not on file   Other Topics Concern  . Not on file   Social History Narrative    Past Surgical Hx:  Past Surgical History  Procedure Laterality Date  . Joint replacement Left     shoulder  . Breast surgery Left 89    lumpectomy  . Eye surgery Bilateral  cataracts  . Wrist fracture surgery Bilateral     right has plate in place  . Reverse shoulder arthroplasty Left 06/22/2013    Procedure: LEFT SHOULDER OPEN REDUCTION INTERNAL FIXATION ;  Surgeon: Marin Shutter, MD;  Location: [place];  Service: Orthopedics;  Laterality: Left;    Past Medical Hx:  Past Medical History  Diagnosis Date  . PONV (postoperative nausea and vomiting)   . Hypertension   . Heart murmur   . Hx-TIA (transient ischemic attack)   . Seizures ([place])     tuesday week ago had last seizure/ grand mal  . Headache(784.0)   . Arthritis     knee  . Cancer (HCC)     breast l  . Anxiety     Past Gynecological History:  G0  No LMP recorded. Patient is postmenopausal.  Family Hx:  Family History  Problem Relation Age of Onset  . Cancer Mother     Review of  Systems:  Constitutional  Feels weak  ENT Normal appearing ears and nares bilaterally Skin/Breast  No rash, jaundice, itching, dryness, + left upper extremity wounds that have been erupting from her anterior left arm Cardiovascular  No chest pain, shortness of breath, or edema  Pulmonary  No cough or wheeze.  Gastro Intestinal  No nausea, vomitting, or diarrhoea. No bright red blood per rectum, no abdominal pain, change in bowel movement, or constipation.  Genito Urinary  No frequency, urgency, dysuria, denies post menopausal bleeding Musculo Skeletal  No myalgia, arthralgia, joint swelling or pain  Neurologic  + weakness, + change in gait, unstable, falls easily  Psychology  No depression, anxiety, insomnia.   Vitals:  Blood pressure 142/80, pulse 89, temperature 97.5 F (36.4 C), temperature source Oral, resp. rate 18, height 5\' 5"  (1.651 m), weight 149 lb 3.2 oz (67.677 kg), SpO2 99 %.  Physical Exam: WD in NAD Neck  Supple NROM, without any enlargements.  Lymph Node Survey No cervical supraclavicular or inguinal adenopathy Cardiovascular  Pulse normal rate, regularity and rhythm. S1 and S2 normal.  Lungs  Clear to auscultation bilateraly, without wheezes/crackles/rhonchi. Good air movement.  Skin  No rash/lesions/breakdown  Psychiatry  Alert and oriented to person, place, and time. Unable to provide a medical history - poor short term memory Abdomen  Normoactive bowel sounds, abdomen soft, non-tender and overweight without evidence of hernia. Palpable firm minimally mobile irregular mass filling lower abdomen below level of umbilicus (nontender) Back No CVA tenderness. Hunched over and extremely kyphotic Genito Urinary  Vulva/vagina: Normal external female genitalia.  No lesions. No discharge or bleeding.  Bladder/urethra:  No lesions or masses, well supported bladder  Vagina: grossly normal  Cervix: unable to visulaize secondary to anterior displacement. However,  palpably normal.  Uterus: appears to be Small, mobile, no parametrial involvement or nodularity. Anteriorally displaced  Adnexa: large, minimally mobile, fixed, irregular, solid mass filling pelvis side to side and pressing against rectum but without obvious infiltration. Nodular. Extends into abdomen. Rectal  No clear cul de sac nodularity, but irregular mass compresses rectum posteriorally.  Extremities  No bilateral cyanosis, clubbing or edema.   Donaciano Eva, MD  12/19/2014, 10:38 AM

## 2014-12-20 LAB — CA 125: CA 125: 942 U/mL — ABNORMAL HIGH (ref <35)

## 2014-12-27 ENCOUNTER — Telehealth: Payer: Self-pay

## 2014-12-27 ENCOUNTER — Other Ambulatory Visit: Payer: Self-pay | Admitting: Radiology

## 2014-12-27 NOTE — Telephone Encounter (Signed)
Orders received from [place] to contact the patient's PCP to obtain surgical clearance on 01/10/1015 for exploratory laparotomy BSO , possible hysterectomy with Dr Everitt Amber. Contacted Dr Ronnald Nian office  [facility] : [phone] , spoke with April , RN with our request for medical clearance ASAP . April, RN states she have the letter written by Dr Burnett Sheng and fax it today . GYN/ONC contact information given phone and fax numbers , April denied additional questions at [place] time .

## 2014-12-28 ENCOUNTER — Ambulatory Visit (HOSPITAL_COMMUNITY)
Admission: RE | Admit: 2014-12-28 | Discharge: 2014-12-28 | Disposition: A | Discharge status: Home / Self Care | Payer: Medicare Other | Source: Ambulatory Visit | Attending: Gynecologic Oncology | Admitting: Gynecologic Oncology

## 2014-12-28 ENCOUNTER — Encounter (HOSPITAL_COMMUNITY): Payer: Self-pay

## 2014-12-28 DIAGNOSIS — Z853 Personal history of malignant neoplasm of breast: Secondary | ICD-10-CM | POA: Insufficient documentation

## 2014-12-28 DIAGNOSIS — I10 Essential (primary) hypertension: Secondary | ICD-10-CM | POA: Diagnosis not present

## 2014-12-28 DIAGNOSIS — Z923 Personal history of irradiation: Secondary | ICD-10-CM | POA: Insufficient documentation

## 2014-12-28 DIAGNOSIS — R569 Unspecified convulsions: Secondary | ICD-10-CM | POA: Diagnosis not present

## 2014-12-28 DIAGNOSIS — R1900 Intra-abdominal and pelvic swelling, mass and lump, unspecified site: Secondary | ICD-10-CM | POA: Diagnosis not present

## 2014-12-28 DIAGNOSIS — Z79899 Other long term (current) drug therapy: Secondary | ICD-10-CM | POA: Insufficient documentation

## 2014-12-28 DIAGNOSIS — Z86718 Personal history of other venous thrombosis and embolism: Secondary | ICD-10-CM | POA: Insufficient documentation

## 2014-12-28 DIAGNOSIS — Z7902 Long term (current) use of antithrombotics/antiplatelets: Secondary | ICD-10-CM | POA: Insufficient documentation

## 2014-12-28 DIAGNOSIS — R413 Other amnesia: Secondary | ICD-10-CM | POA: Insufficient documentation

## 2014-12-28 DIAGNOSIS — I82401 Acute embolism and thrombosis of unspecified deep veins of right lower extremity: Secondary | ICD-10-CM

## 2014-12-28 DIAGNOSIS — Z8673 Personal history of transient ischemic attack (TIA), and cerebral infarction without residual deficits: Secondary | ICD-10-CM | POA: Diagnosis not present

## 2014-12-28 DIAGNOSIS — I890 Lymphedema, not elsewhere classified: Secondary | ICD-10-CM | POA: Diagnosis not present

## 2014-12-28 LAB — CBC
HCT: 36.7 % (ref 36.0–46.0)
HEMOGLOBIN: 11.6 g/dL — AB (ref 12.0–15.0)
MCH: 25.4 pg — ABNORMAL LOW (ref 26.0–34.0)
MCHC: 31.6 g/dL (ref 30.0–36.0)
MCV: 80.5 fL (ref 78.0–100.0)
PLATELETS: 447 10*3/uL — AB (ref 150–400)
RBC: 4.56 MIL/uL (ref 3.87–5.11)
RDW: 17.1 % — ABNORMAL HIGH (ref 11.5–15.5)
WBC: 11.7 10*3/uL — ABNORMAL HIGH (ref 4.0–10.5)

## 2014-12-28 LAB — BASIC METABOLIC PANEL
ANION GAP: 11 (ref 5–15)
BUN: 19 mg/dL (ref 6–20)
CALCIUM: 9.4 mg/dL (ref 8.9–10.3)
CO2: 24 mmol/L (ref 22–32)
Chloride: 100 mmol/L — ABNORMAL LOW (ref 101–111)
Creatinine, Ser: 0.72 mg/dL (ref 0.44–1.00)
GLUCOSE: 99 mg/dL (ref 65–99)
Potassium: 4 mmol/L (ref 3.5–5.1)
Sodium: 135 mmol/L (ref 135–145)

## 2014-12-28 LAB — PROTIME-INR
INR: 1.38 (ref 0.00–1.49)
Prothrombin Time: 17.1 seconds — ABNORMAL HIGH (ref 11.6–15.2)

## 2014-12-28 LAB — APTT: APTT: 39 s — AB (ref 24–37)

## 2014-12-28 MED ORDER — MIDAZOLAM HCL 2 MG/2ML IJ SOLN
INTRAMUSCULAR | Status: AC | PRN
  Administered 2014-12-28: 0.5 mg via INTRAVENOUS

## 2014-12-28 MED ORDER — FENTANYL CITRATE (PF) 100 MCG/2ML IJ SOLN
INTRAMUSCULAR | Status: AC | PRN
  Administered 2014-12-28: 25 ug via INTRAVENOUS

## 2014-12-28 MED ORDER — SODIUM CHLORIDE 0.9 % IV SOLN
Freq: Once | INTRAVENOUS | Status: AC
  Administered 2014-12-28: 10:00:00 via INTRAVENOUS

## 2014-12-28 MED ORDER — IOHEXOL 300 MG/ML  SOLN
45.0000 mL | Freq: Once | INTRAMUSCULAR | Status: DC | PRN
  Administered 2014-12-28: 45 mL via INTRAVENOUS
  Filled 2014-12-28: qty 50

## 2014-12-28 MED ORDER — FENTANYL CITRATE (PF) 100 MCG/2ML IJ SOLN
INTRAMUSCULAR | Status: AC
  Filled 2014-12-28: qty 4

## 2014-12-28 MED ORDER — MIDAZOLAM HCL 2 MG/2ML IJ SOLN
INTRAMUSCULAR | Status: AC
  Filled 2014-12-28: qty 6

## 2014-12-28 MED ORDER — LIDOCAINE HCL 1 % IJ SOLN
INTRAMUSCULAR | Status: AC
  Filled 2014-12-28: qty 20

## 2014-12-28 NOTE — Discharge Instructions (Signed)
Venogram °A venogram, or venography, is a procedure to look at the veins using X-ray and dye (contrast). Contrast helps the veins show up on X-rays. A venography evaluates vein abnormalities, identifies clots within veins such as deep vein thrombosis (DVT), and evaluates swelling in an arm or leg. °LET YOUR HEALTH CARE PROVIDER KNOW ABOUT:  °· Any allergies you have, especially to medicines, shellfish, iodine, and contrast. °· All medicines you are taking, including vitamins, herbs, eye drops, creams, and over-the-counter medicines. °· Any previous complications from this or other procedures. °· Any smoking history. °· Any possibility of pregnancy. °· Any blood disorders you have. °· Previous surgeries you have had. °· Medical conditions you have. °RISKS AND COMPLICATIONS  °Generally, this is a safe procedure. However, as with any procedure, problems can occur. Possible problems include: °· Blood clots. °· Kidney problems. °· Allergic reaction to the contrast. °· Bleeding. °· Infection. °· X-ray exposure. °BEFORE THE PROCEDURE  °· Ask your health care provider about changing or stopping your regular medicines. This is especially important if you are taking diabetes medicines or blood thinners. °· Your health care provider may want you to have blood tests. These tests can help tell how well your kidneys and liver are working. They can also show how well your blood clots.   °· Do not eat or drink anything after midnight on the night before the procedure or as directed by your health care provider. °· A blood sample may be drawn. °· An IV tube will be placed into a vein in your arm or leg. °· You will be asked to remove your clothing and put on a hospital gown. °· You may be asked to remove your watch or jewelry. °· Arrange for someone to drive you home after the procedure. If you receive pain medicine or sedatives during the procedure, it will be unsafe for you to drive for a few hours after the procedure. °PROCEDURE    °· You will have an IV tube placed in a vein. This is where your health care provider will inject the dye. You may also be given medicine through the IV tube to help you relax (sedation). °· During the exam, you will lie on an X-ray table. The table may be tilted in different directions during the procedure to help the dye move throughout your body. Safety straps will keep you secure if the table is tilted. °· If the veins to be evaluated are in your arm or leg, a band may be wrapped around that arm or leg to make the veins stay full of blood. You may feel like your arm or leg is going to sleep. °· When the contrast is injected into your vein, you may notice a hot, flushed feeling as it moves throughout your body. You may also notice a metallic taste in your mouth. Both of these sensations will go away after the test is complete. °· You may be asked to lie in different positions or place your extremity in different positions. °· At the end of the procedure, you will be given extra IV fluids to help flush the contrast material from your veins. °· When the IV tube is removed, pressure will be applied to prevent bleeding. A bandage may be applied. °AFTER THE PROCEDURE  °· You will be checked frequently after the procedure. You may feel sleepy during this time if you were given a sedation medicine during the procedure. °· You may be given something to eat and drink. °·   You will be instructed to drink a lot of fluids for the remainder of the day and into the next day, to help flush the contrast from your body. °  °This information is not intended to replace advice given to you by your health care provider. Make sure you discuss any questions you have with your health care provider. °  °Document Released: 01/16/2009 Document Revised: 02/18/2014 Document Reviewed: 10/05/2012 °Elsevier Interactive Patient Education ©2016 Elsevier Inc[employer] ° ° ° ° °Inferior Vena Cava Filter Insertion °Insertion of an inferior vena cava (IVC)  filter is a procedure in which a filter is placed into the large vein in your abdomen that carries blood from the lower part of your body to your heart (inferior vena cava). Placement of the filter here helps prevent blood clots in the legs or pelvis from traveling to your lungs. A large blood clot in the lungs can cause death.  °The filter is a small, metal device about an inch long. It is shaped like the spokes of an umbrella and is inserted through a pathway created in your neck or groin. The risks of this procedure are usually small and easily managed. Inferior vena cava filters are only used when blood thinners (anticoagulants) cannot be used to prevent blood clots from forming. This may occur because of: °· You have severe platelet problems or shortage. °· You have had recent or current major bleeding that cannot be treated. °· You have bleeding associated with anticoagulants. °· You have recurrence of blood clots while on anticoagulants. °· You have a need for surgery in the near future. °· You have bleeding in your head. °EXPECTATIONS OF A FILTER °· An IVC filter will reduce the risk of a large blood clot making its way to your lungs (pulmonary embolus, or PE). It cannot eliminate the risk completely, or prevent small PEs from occurring. °· It does not stop blood clots from growing, recurring, or developing into postphlebitic syndrome. This is a condition that can occur after there is inflammation in a vein (phlebitis). °LET YOUR HEALTH CARE PROVIDER KNOW ABOUT: °· Any allergies you have. This includes an allergy to iodine or contrast dye. °· All medicines you are taking, including vitamins, herbs, eye drops, creams and over-the-counter medicines. °· Previous problems you or members of your family have had with the use of anesthetics. °· Any blood disorders you have. °· Previous surgeries you had had. °· Medical conditions you have. °· Possibility of pregnancy, if this applies. °RISKS AND  COMPLICATIONS °Generally, this is a safe procedure. However, as with any procedure, problems can occur. Possible problems include: °· A small bruise around the needle insertion site. A larger pooling of blood called a hematoma may form. This is usually of no concern. °· The filter can block the vena cava. This can cause some swelling of the legs. °· The filter may eventually fail and not work properly. °· Continued bleeding or infections (uncommon). °· Damage to the vein by the catheter (rare). °BEFORE THE PROCEDURE °· Your health care provider may want you to have blood tests. These tests can help tell how well your kidneys and liver are working. They can also show how well your blood clots. °· If you take blood thinners, ask your health care provider if and when you should stop taking them. °· Do not eat or drink for 4 hours before the procedure or as directed by your health care provider. °· Make arrangements for someone to drive you home. Depending   on the procedure, you may be °able to go home the same day.  °PROCEDURE  °· Insertion of a Vena Cava filter is performed by a vascular surgeon or cardiologist in a cardiac catheterization lab. °· The procedure usually takes about 30 minutes to 1 hour. This can vary. °· An IV needle will be inserted into one of your veins. Medicine will be able to flow directly into your body through this needle. °· Medicines may given to help you relax and relieve anxiety (sedative). °· The procedure is done through a large vein either in your neck or groin. The skin around this area is cleaned and shaved, as necessary. °· The skin and deeper tissues over the vein will be made numb with a local anesthetic. You are awake during the procedure and can let your health care providers know if you have discomfort. °· A needle is then put into the vein. A guide wire is placed through the needle and into the vein. This is used to help insert a catheter and the IVC filter into your  vein. °· Contrast dye may be injected into the inferior vena cava to help guide the catheter and verify precise placement of the IVC filter. X-ray equipment may also be used to verify that the catheter and the wire are in the correct position. °· The wire is then withdrawn. °· The IVC filter is then passed over the catheter into the vein, and inserted into the correct location in the vena cava. °· The catheter is then removed. Pressure will be kept on the needle insertion point for several minutes or until it is unlikely to bleed. °AFTER THE PROCEDURE °· You will stay in a recovery area until any sedation medicine you were given has worn off. Your blood pressure and  pulse will be checked. °· If there are no problems, you should be able to go home after the procedure. °· You may feel sore at the area of the needle insertion for a few days. °  °This information is not intended to replace advice given to you by your health care provider. Make sure you discuss any questions you have with your health care provider. °  °Document Released: 03/20/2005 Document Revised: 02/18/2014 Document Reviewed: 10/05/2012 °Elsevier Interactive Patient Education ©2016 Elsevier Inc. ° ° ° °Moderate Conscious Sedation, Adult °Sedation is the use of medicines to promote relaxation and relieve discomfort and anxiety. Moderate conscious sedation is a type of sedation. Under moderate conscious sedation you are less alert than normal but are still able to respond to instructions or stimulation. Moderate conscious sedation is used during short medical and dental procedures. It is milder than deep sedation or general anesthesia and allows you to return to your regular activities sooner. °LET YOUR HEALTH CARE PROVIDER KNOW ABOUT:  °· Any allergies you have. °· All medicines you are taking, including vitamins, herbs, eye drops, creams, and over-the-counter medicines. °· Use of steroids (by mouth or creams). °· Previous problems you or members of  your family have had with the use of anesthetics. °· Any blood disorders you have. °· Previous surgeries you have had. °· Medical conditions you have. °· Possibility of pregnancy, if this applies. °· Use of cigarettes, alcohol, or illegal drugs. °RISKS AND COMPLICATIONS °Generally, this is a safe procedure. However, as with any procedure, problems can occur. Possible problems include: °· Oversedation. °· Trouble breathing on your own. You may need to have a breathing tube until you are awake and breathing on your   own. °· Allergic reaction to any of the medicines used for the procedure. °BEFORE THE PROCEDURE °· You may have blood tests done. These tests can help show how well your kidneys and liver are working. They can also show how well your blood clots. °· A physical exam will be done.   °· Only take medicines as directed by your health care provider. You may need to stop taking medicines (such as blood thinners, aspirin, or nonsteroidal anti-inflammatory drugs) before the procedure.   °· Do not eat or drink at least 6 hours before the procedure or as directed by your health care provider. °· Arrange for a responsible adult, family member, or friend to take you home after the procedure. He or she should stay with you for at least 24 hours after the procedure, until the medicine has worn off. °PROCEDURE  °· An intravenous (IV) catheter will be inserted into one of your veins. Medicine will be able to flow directly into your body through this catheter. You may be given medicine through this tube to help prevent pain and help you relax. °· The medical or dental procedure will be done. °AFTER THE PROCEDURE °· You will stay in a recovery area until the medicine has worn off. Your blood pressure and pulse will be checked.   °·  Depending on the procedure you had, you may be allowed to go home when you can tolerate liquids and your pain is under control. °  °This information is not intended to replace advice given to you  by your health care provider. Make sure you discuss any questions you have with your health care provider. °  °Document Released: 10/23/2000 Document Revised: 02/18/2014 Document Reviewed: 10/05/2012 °Elsevier Interactive Patient Education ©2016 Elsevier Inc. ° °

## 2014-12-28 NOTE — Procedures (Signed)
Interventional Radiology Procedure Note  Procedure: Right IJ approach IVC filter.  Bard Denali retrievable filter for peri-surgery prophylaxis.  Complications: None Recommendations:  - Ok to shower tomorrow  - may refer to IR for eval of IVC filter removal when risk is decreased or on anti-coagulation - Routine wound care   Signed,  Dulcy Fanny. Earleen Newport, DO

## 2014-12-28 NOTE — H&P (Signed)
Chief Complaint: Patient was seen in consultation today for IVC filter placement  Referring Physician(s): Cross,Melissa D/Rossi,E  History of Present Illness: Kristina Phelps is a 79 y.o. female with history of hypertension, prior TIA, seizures, remote breast cancer, right lower extremity DVT diagnosed in September 2016 treated with Xarelto and now with large abdominopelvic mass. Recent imaging performed at [facility] in October of this year revealed 4 mm nodule in right midlung, mild left hydroureteronephrosis as well as a large heterogeneously enhancing pelvic mass measuring 20.7 x 14.7 x 14 cm. Patient had a left ureteral stent placed by urology which later expelled spontaneously 1 week later. Patient also noted to have long-term left upper extremity lymphedema secondary to prior surgery and radiation for breast cancer. She does exhibit some signs of early dementia with short-term memory loss. She presents today for IVC filter placement prior to anticipated bowel surgery.  Past Medical History  Diagnosis Date  . PONV (postoperative nausea and vomiting)   . Hypertension   . Heart murmur   . Hx-TIA (transient ischemic attack) sept 2016  . Seizures ([place])     tuesday week ago had last seizure/ grand mal  . Headache(784.0)   . Arthritis     knee  . Cancer (HCC)     breast l  . Anxiety     Past Surgical History  Procedure Laterality Date  . Joint replacement Left     shoulder  . Breast surgery Left 95    lumpectomy  . Eye surgery Bilateral     cataracts  . Wrist fracture surgery Bilateral     right has plate in place  . Reverse shoulder arthroplasty Left 06/22/2013    Procedure: LEFT SHOULDER OPEN REDUCTION INTERNAL FIXATION ;  Surgeon: Marin Shutter, MD;  Location: [place];  Service: Orthopedics;  Laterality: Left;  . Closed reduction elbow fracture  Jan 2016    elbow fracture left  . Cyst excision Left 2016 Oct    upper arm    Allergies: Depakote; Nexium; and  Valium  Medications: Prior to Admission medications   Medication Sig Start Date End Date Taking? Authorizing Provider  lacosamide (VIMPAT) 200 MG TABS tablet Take 200 mg by mouth 2 (two) times daily.   Yes Historical Provider, MD  losartan-hydrochlorothiazide (HYZAAR) 100-12.5 MG per tablet Take 1 tablet by mouth daily.   Yes Historical Provider, MD  rivaroxaban (XARELTO) 20 MG TABS tablet Take 20 mg by mouth daily with supper.   Yes Historical Provider, MD  aspirin 325 MG tablet Take 650 mg by mouth daily as needed for mild pain or headache.    Historical Provider, MD  methocarbamol (ROBAXIN) 500 MG tablet Take 1 tablet (500 mg total) by mouth 3 (three) times daily as needed for muscle spasms. 06/23/13   Olivia Mackie Shuford, PA-C  nebivolol (BYSTOLIC) 10 MG tablet Take 10 mg by mouth daily.    Historical Provider, MD  simvastatin (ZOCOR) 20 MG tablet Take 20 mg by mouth at bedtime.    Historical Provider, MD     Family History  Problem Relation Age of Onset  . Cancer Mother     Social History   Social History  . Marital Status: Married    Spouse Name: N/A  . Number of Children: N/A  . Years of Education: N/A   Social History Main Topics  . Smoking status: Never Smoker   . Smokeless tobacco: None  . Alcohol Use: No  . Drug Use:  No  . Sexual Activity: Not Asked   Other Topics Concern  . None   Social History Narrative     Review of Systems  Constitutional: Negative for fever and chills.  Respiratory: Negative for cough and shortness of breath.   Cardiovascular: Negative for chest pain.  Gastrointestinal: Negative for nausea, vomiting, abdominal pain and blood in stool.  Genitourinary: Negative for dysuria and hematuria.       Urinary incontinence  Musculoskeletal: Negative for back pain.       Chronic left upper extremity lymphedema  Neurological: Negative for headaches.       Short-term memory loss  Psychiatric/Behavioral: Positive for confusion.    Vital Signs: BP  145/80 mmHg  Pulse 86  Temp(Src) 97.6 F (36.4 C) (Oral)  Resp 16  SpO2 94%  Physical Exam  Constitutional:  Frail WF in NAD  Cardiovascular: Normal rate and regular rhythm.   Pulmonary/Chest: Effort normal and breath sounds normal.  Abdominal: Soft. Bowel sounds are normal.  Large palpable nontender pelvic mass  Musculoskeletal:  Chronic left upper extremity lymphedema, brace in place  Neurological:  Patient oriented to date and first name but not day of week or place    Mallampati Score:     Imaging: No results found.  Labs:  CBC:  Recent Labs  12/28/14 0950  WBC 11.7*  HGB 11.6*  HCT 36.7  PLT 447*    COAGS: No results for input(s): INR, APTT in the last 8760 hours.  BMP:  Recent Labs  12/19/14 1113  NA 136  K 4.5  CO2 23  GLUCOSE 92  BUN 19.0  CALCIUM 10.2  CREATININE 0.7    LIVER FUNCTION TESTS: No results for input(s): BILITOT, AST, ALT, ALKPHOS, PROT, ALBUMIN in the last 8760 hours.  TUMOR MARKERS: No results for input(s): AFPTM, CEA, CA199, CHROMGRNA in the last 8760 hours.  Assessment and Plan: Pt with history of hypertension, prior TIA, seizures, remote breast cancer, right lower extremity DVT diagnosed in September 2016 treated with Xarelto and now with large abdominopelvic mass. Patient is tentatively scheduled for abdominal surgery later this month and request now made for IVC filter placement preop. Risks and benefits discussed with the patient/husband/POA including, but not limited to bleeding, infection, contrast induced renal failure, filter fracture or migration which can lead to emergency surgery or even death, strut penetration with damage or irritation to adjacent structures and caval thrombosis. All of the patient's questions were answered, patient is agreeable to proceed. Consent signed and in chart.      Thank you for this interesting consult.  I greatly enjoyed meeting Kristina Phelps and look forward to participating  in their care.  A copy of this report was sent to the requesting provider on this date.  Signed: D. Rowe Robert 12/28/2014, 10:25 AM   I spent a total of 15 minutes in face to face in clinical consultation, greater than 50% of which was counseling/coordinating care for IVC filter placement

## 2014-12-28 NOTE — Progress Notes (Signed)
Pt states DOB getting numbers transposed. Family reports that she has had difficulty articulating thoughts for past 2 months but has not seen her neurologist about that and weakness / multiple falls with fractures.  PA Rowe Robert informed.

## 2014-12-29 ENCOUNTER — Emergency Department (HOSPITAL_COMMUNITY)
Admission: EM | Admit: 2014-12-29 | Discharge: 2014-12-29 | Disposition: A | Discharge status: Home / Self Care | Payer: Medicare Other | Attending: Emergency Medicine | Admitting: Emergency Medicine

## 2014-12-29 ENCOUNTER — Emergency Department (HOSPITAL_COMMUNITY): Payer: Medicare Other

## 2014-12-29 ENCOUNTER — Encounter (HOSPITAL_COMMUNITY): Payer: Self-pay | Admitting: *Deleted

## 2014-12-29 ENCOUNTER — Other Ambulatory Visit: Payer: Self-pay

## 2014-12-29 DIAGNOSIS — Z7901 Long term (current) use of anticoagulants: Secondary | ICD-10-CM | POA: Diagnosis not present

## 2014-12-29 DIAGNOSIS — Z8673 Personal history of transient ischemic attack (TIA), and cerebral infarction without residual deficits: Secondary | ICD-10-CM | POA: Insufficient documentation

## 2014-12-29 DIAGNOSIS — R410 Disorientation, unspecified: Secondary | ICD-10-CM | POA: Diagnosis not present

## 2014-12-29 DIAGNOSIS — Z79899 Other long term (current) drug therapy: Secondary | ICD-10-CM | POA: Insufficient documentation

## 2014-12-29 DIAGNOSIS — F1310 Sedative, hypnotic or anxiolytic abuse, uncomplicated: Secondary | ICD-10-CM | POA: Diagnosis not present

## 2014-12-29 DIAGNOSIS — Z853 Personal history of malignant neoplasm of breast: Secondary | ICD-10-CM | POA: Insufficient documentation

## 2014-12-29 DIAGNOSIS — Z8739 Personal history of other diseases of the musculoskeletal system and connective tissue: Secondary | ICD-10-CM | POA: Insufficient documentation

## 2014-12-29 DIAGNOSIS — G40909 Epilepsy, unspecified, not intractable, without status epilepticus: Secondary | ICD-10-CM | POA: Diagnosis not present

## 2014-12-29 DIAGNOSIS — R1900 Intra-abdominal and pelvic swelling, mass and lump, unspecified site: Secondary | ICD-10-CM | POA: Diagnosis not present

## 2014-12-29 DIAGNOSIS — I10 Essential (primary) hypertension: Secondary | ICD-10-CM | POA: Diagnosis not present

## 2014-12-29 DIAGNOSIS — R011 Cardiac murmur, unspecified: Secondary | ICD-10-CM | POA: Insufficient documentation

## 2014-12-29 DIAGNOSIS — R4182 Altered mental status, unspecified: Secondary | ICD-10-CM | POA: Diagnosis present

## 2014-12-29 DIAGNOSIS — Z8659 Personal history of other mental and behavioral disorders: Secondary | ICD-10-CM | POA: Diagnosis not present

## 2014-12-29 LAB — TSH: TSH: 2.019 u[IU]/mL (ref 0.350–4.500)

## 2014-12-29 LAB — CBC WITH DIFFERENTIAL/PLATELET
Basophils Absolute: 0 10*3/uL (ref 0.0–0.1)
Basophils Relative: 0 %
EOS ABS: 0.1 10*3/uL (ref 0.0–0.7)
EOS PCT: 1 %
HCT: 35.4 % — ABNORMAL LOW (ref 36.0–46.0)
HEMOGLOBIN: 11.1 g/dL — AB (ref 12.0–15.0)
LYMPHS ABS: 1.7 10*3/uL (ref 0.7–4.0)
Lymphocytes Relative: 17 %
MCH: 25.6 pg — AB (ref 26.0–34.0)
MCHC: 31.4 g/dL (ref 30.0–36.0)
MCV: 81.6 fL (ref 78.0–100.0)
MONO ABS: 1.1 10*3/uL — AB (ref 0.1–1.0)
MONOS PCT: 11 %
Neutro Abs: 7.3 10*3/uL (ref 1.7–7.7)
Neutrophils Relative %: 71 %
PLATELETS: 371 10*3/uL (ref 150–400)
RBC: 4.34 MIL/uL (ref 3.87–5.11)
RDW: 17.2 % — AB (ref 11.5–15.5)
WBC: 10.2 10*3/uL (ref 4.0–10.5)

## 2014-12-29 LAB — URINALYSIS, ROUTINE W REFLEX MICROSCOPIC
BILIRUBIN URINE: NEGATIVE
Glucose, UA: NEGATIVE mg/dL
Ketones, ur: NEGATIVE mg/dL
Leukocytes, UA: NEGATIVE
Nitrite: NEGATIVE
Protein, ur: 30 mg/dL — AB
SPECIFIC GRAVITY, URINE: 1.022 (ref 1.005–1.030)
pH: 6 (ref 5.0–8.0)

## 2014-12-29 LAB — COMPREHENSIVE METABOLIC PANEL
ALK PHOS: 163 U/L — AB (ref 38–126)
ALT: 34 U/L (ref 14–54)
AST: 76 U/L — AB (ref 15–41)
Albumin: 3 g/dL — ABNORMAL LOW (ref 3.5–5.0)
Anion gap: 7 (ref 5–15)
BUN: 19 mg/dL (ref 6–20)
CHLORIDE: 103 mmol/L (ref 101–111)
CO2: 25 mmol/L (ref 22–32)
CREATININE: 0.75 mg/dL (ref 0.44–1.00)
Calcium: 8.9 mg/dL (ref 8.9–10.3)
GFR calc Af Amer: >60 mL/min (ref >60)
GFR calc non Af Amer: >60 mL/min (ref >60)
GLUCOSE: 106 mg/dL — AB (ref 65–99)
Potassium: 4.3 mmol/L (ref 3.5–5.1)
SODIUM: 135 mmol/L (ref 135–145)
Total Bilirubin: 0.8 mg/dL (ref 0.3–1.2)
Total Protein: 6.9 g/dL (ref 6.5–8.1)

## 2014-12-29 LAB — RAPID URINE DRUG SCREEN, HOSP PERFORMED
Amphetamines: NOT DETECTED
BARBITURATES: NOT DETECTED
BENZODIAZEPINES: POSITIVE — AB
Cocaine: NOT DETECTED
Opiates: NOT DETECTED
Tetrahydrocannabinol: NOT DETECTED

## 2014-12-29 LAB — CBG MONITORING, ED: Glucose-Capillary: 114 mg/dL — ABNORMAL HIGH (ref 65–99)

## 2014-12-29 LAB — I-STAT CG4 LACTIC ACID, ED: LACTIC ACID, VENOUS: 1.77 mmol/L (ref 0.5–2.0)

## 2014-12-29 LAB — URINE MICROSCOPIC-ADD ON

## 2014-12-29 MED ORDER — IOHEXOL 300 MG/ML  SOLN
100.0000 mL | Freq: Once | INTRAMUSCULAR | Status: AC | PRN
  Administered 2014-12-29: 100 mL via INTRAVENOUS

## 2014-12-29 MED ORDER — SODIUM CHLORIDE 0.9 % IV BOLUS (SEPSIS)
1000.0000 mL | Freq: Once | INTRAVENOUS | Status: AC
  Administered 2014-12-29: 1000 mL via INTRAVENOUS

## 2014-12-29 MED ORDER — IOHEXOL 300 MG/ML  SOLN: 50.0000 mL | Freq: Once | INTRAMUSCULAR | Status: DC | PRN

## 2014-12-29 NOTE — ED Notes (Signed)
Nurse drawing labs. 

## 2014-12-29 NOTE — ED Notes (Addendum)
Verbalized understanding discharge instructions. In no acute distress.  Pt and family verbalized understanding that they need to contact Case Manager when they decide on a [place].

## 2014-12-29 NOTE — ED Notes (Signed)
md at bedside

## 2014-12-29 NOTE — ED Notes (Addendum)
Pt reports and from provider note from yesterday, history of hypertension, prior TIA, seizures, remote breast cancer, right lower extremity DVT treated with Xarelto and now with large abdominopelvic mass, also have a nodule in right midlung. r revealed 4 mm nodule in right midlung, mild left hydroureteronephrosis as well as a large heterogeneously enhancing pelvic mass measuring 20.7 x 14.7 x 14 cm.  She does exhibit some signs of early dementia with short-term memory loss. Pt had IVC filter placed yesterday 11/16 for anticipated bowel surgery on 11/29.   Niece, Danton Sewer, ([place] with paperwork), states pt has been declining since September. Pt fell in Sept and broke thumb and had 16 stitches in head placed. Pt is getting to the point where she is having difficulty swallowing, walking (husband is carrying her everywhere), "cant think of words to say them", personality change, (getting angry all of a sudden),  yesterday pt was telling stories that were not logically making sense, (telling POA about POA daughter, but daughter had a different mother), calling her husband a "[place] Boy" (husband has never lived in [place]).  pts brother died of massive stroke at 37 years old.

## 2014-12-29 NOTE — ED Notes (Signed)
Pt has noticeable difficulty swallowing at times, pt has intermittent coughing spells. Pt able to speak in short sentences, but appears SOB.

## 2014-12-29 NOTE — Progress Notes (Addendum)
Kristina Phelps [phone] Niece and POA Please call POA each time call Pt  Cm provided Apolonio Schneiders with information for [facility] Medicaid application, PDN list and [facility] list  Encouraged applying for Medicaid even though report pt and spouse with IRA assets  Reports pt to be in snf after surgery soon   Please Call Case manager when You decide on choice of home health agency ASAP Call on 12/29/2014 Cm will then contact the appropriate agency to complete your referral

## 2014-12-29 NOTE — Progress Notes (Addendum)
   12/29/14 0000  CM Assessment  Expected Discharge [place]  In-house Referral NA  Discharge Planning Services CM Consult  [facility] Choice NA  Choice offered to / list presented to  Patient  Status of Service In process, will continue to follow  Discharge Disposition Home w [facility]   CM reviewed in details medicare guidelines, home health [facility]) (length of stay in home, types of [facility] staff available, coverage, primary caregiver, up to 24 hrs before services may be started) and Private duty nursing (PDN-coverage, length of stay in the home types of staff available). CM reviewed availability of [facility] to assist pcp to get pt to snf (if desired disposition) from the community level. CM provided pt/family with a list of [place] home health agencies and PDN.   Updated EDP, ED RN  Husband is primary caregiver He assisted CM with getting pt to bedpan   Pending response from pt after consulted with her sister about available home health agencies  Pt to return call to CM mobile # CM explained that agency will not call until after she has provided Cm with her choice of agency  DME ordered - Bedside commode Pt has a walker and elevated toilet seat per husband   CM did go through the PDN and [facility] lists individually with Niece at nursing station

## 2014-12-29 NOTE — ED Provider Notes (Signed)
CSN: [id number]     Arrival date & time 12/29/14  [id number] History   First MD Initiated Contact with Patient 12/29/14 1008     Chief Complaint  Patient presents with  . Altered Mental Status     (Consider location/radiation/quality/duration/timing/severity/associated sxs/prior Treatment) Patient is a 79 y.o. female presenting with altered mental status.  Altered Mental Status Presenting symptoms: confusion and disorientation   Severity:  Mild Most recent episode:  Today Episode history:  Single Duration:  2 weeks Timing:  Constant Progression:  Worsening Chronicity:  New Context: recent illness   Context: not alcohol use, not dementia, not drug use and not head injury   Associated symptoms: no abdominal pain, no fever, no hallucinations, no nausea, no rash and no vomiting     Past Medical History  Diagnosis Date  . PONV (postoperative nausea and vomiting)   . Hypertension   . Heart murmur   . Hx-TIA (transient ischemic attack) sept 2016  . Seizures ([place])     tuesday week ago had last seizure/ grand mal  . Headache(784.0)   . Arthritis     knee  . Cancer (HCC)     breast l  . Anxiety    Past Surgical History  Procedure Laterality Date  . Joint replacement Left     shoulder  . Breast surgery Left 95    lumpectomy  . Eye surgery Bilateral     cataracts  . Wrist fracture surgery Bilateral     right has plate in place  . Reverse shoulder arthroplasty Left 06/22/2013    Procedure: LEFT SHOULDER OPEN REDUCTION INTERNAL FIXATION ;  Surgeon: Marin Shutter, MD;  Location: [place];  Service: Orthopedics;  Laterality: Left;  . Closed reduction elbow fracture  Jan 2016    elbow fracture left  . Cyst excision Left 2016 Oct    upper arm  . Ivc filter placement ([facility] hx)      12/28/15   Family History  Problem Relation Age of Onset  . Cancer Mother   . Stroke Brother    Social History  Substance Use Topics  . Smoking status: Never Smoker   . Smokeless tobacco: None  .  Alcohol Use: No   OB History    No data available     Review of Systems  Constitutional: Negative for fever and chills.  Eyes: Negative for photophobia and pain.  Respiratory: Negative for cough and shortness of breath.   Cardiovascular: Negative for chest pain and leg swelling.  Gastrointestinal: Negative for nausea, vomiting and abdominal pain.  Skin: Negative for rash and wound.  Psychiatric/Behavioral: Positive for confusion. Negative for hallucinations.  All other systems reviewed and are negative.     Allergies  Depakote; Nexium; and Valium  Home Medications   Prior to Admission medications   Medication Sig Start Date End Date Taking? Authorizing Provider  lacosamide (VIMPAT) 200 MG TABS tablet Take 200 mg by mouth 2 (two) times daily.   Yes Historical Provider, MD  losartan (COZAAR) 100 MG tablet Take 100 mg by mouth daily.   Yes Historical Provider, MD  nebivolol (BYSTOLIC) 10 MG tablet Take 10 mg by mouth daily.   Yes Historical Provider, MD  rivaroxaban (XARELTO) 20 MG TABS tablet Take 20 mg by mouth daily with supper.   Yes Historical Provider, MD  simvastatin (ZOCOR) 20 MG tablet Take 20 mg by mouth at bedtime.   Yes Historical Provider, MD  methocarbamol (ROBAXIN) 500 MG tablet Take 1 tablet (  500 mg total) by mouth 3 (three) times daily as needed for muscle spasms. Patient not taking: Reported on 12/29/2014 06/23/13   Olivia Mackie Shuford, PA-C   BP 114/83 mmHg  Pulse 79  Temp(Src) 98.1 F (36.7 C) (Oral)  Resp 26  Wt 150 lb (68.04 kg)  SpO2 93% Physical Exam  Constitutional: She is oriented to person, place, and time. She appears well-developed and well-nourished.  HENT:  Head: Normocephalic and atraumatic.  Neck: Normal range of motion.  Cardiovascular: Normal rate and regular rhythm.   Pulmonary/Chest: No stridor. No respiratory distress.  Abdominal: Soft. Bowel sounds are normal. She exhibits no distension. There is no tenderness. There is no rebound.   Neurological: She is alert and oriented to person, place, and time. No cranial nerve deficit. Coordination normal.  Skin: Skin is warm and dry. No rash noted. No erythema.  Nursing note and vitals reviewed.   ED Course  Procedures (including critical care time) Labs Review Labs Reviewed  COMPREHENSIVE METABOLIC PANEL - Abnormal; Notable for the following:    Glucose, Bld 106 (*)    Albumin 3.0 (*)    AST 76 (*)    Alkaline Phosphatase 163 (*)    All other components within normal limits  CBC WITH DIFFERENTIAL/PLATELET - Abnormal; Notable for the following:    Hemoglobin 11.1 (*)    HCT 35.4 (*)    MCH 25.6 (*)    RDW 17.2 (*)    Monocytes Absolute 1.1 (*)    All other components within normal limits  URINALYSIS, ROUTINE W REFLEX MICROSCOPIC (NOT AT [facility]) - Abnormal; Notable for the following:    Hgb urine dipstick SMALL (*)    Protein, ur 30 (*)    All other components within normal limits  URINE RAPID DRUG SCREEN, HOSP PERFORMED - Abnormal; Notable for the following:    Benzodiazepines POSITIVE (*)    All other components within normal limits  URINE MICROSCOPIC-ADD ON - Abnormal; Notable for the following:    Squamous Epithelial / LPF 0-5 (*)    Bacteria, UA RARE (*)    All other components within normal limits  CBG MONITORING, ED - Abnormal; Notable for the following:    Glucose-Capillary 114 (*)    All other components within normal limits  URINE CULTURE  TSH  I-STAT CG4 LACTIC ACID, ED  CBG MONITORING, ED    Imaging Review No results found. I have personally reviewed and evaluated these images and lab results as part of my medical decision-making.   EKG Interpretation None      MDM   Final diagnoses:  Delirium   79 year old female with multiple medical problems and currently getting worked up for likely uterine cancer presents emergency department today for altered mental status. This is intermittent in nature. She is becoming increasingly weak and then  agitated and confused especially at night she'll have episodes of confusion and agitation. Husband is having more more difficult to take care of her at home secondary to this. Examination here patient's alert and oriented to person and place but not time or situation. She has a suprapubic mass is palpable but other than that no obvious physical exam findings to explain her symptoms. Workup was otherwise negative as as document above. No obvious infections or metastasis of her likely cancer. Likely delirium 2/2 her illness.  Care management involved and we will get physical therapy, occupational therapy, nursing and an aide in the home to help husband at least until the patient had surgery  in a couple weeks after which she will likely go to rehabilitation. Will return here for any new or worsening symptoms.   Merrily Pew, MD 12/31/14 [phone]

## 2014-12-29 NOTE — Progress Notes (Signed)
Apolonio Schneiders given  [address] [place] [address]     map  [address] [place], [place] [id number]     map  . Main Number: [id number]  Fax: [phone] . email DSS . Director: [phone]  [place]: [phone]  Adult Protective Services: [phone]  [place]: [phone] Child Protective Services: [phone]  Food Stamps: [phone]  Medicaid (Adult): [phone]  Medicaid (Family & Children): [phone]  Non-Emergency Medical Transportation:  [phone] Social [employer]: [phone] . [employer] Employment: [phone]  Work First Family Assistance: [phone]      .  Web site to complete Medicaid application  https://tate.info/ And information on medicaid eligibility requirements [place] pt and husband has not shared information on finances with her yet

## 2014-12-30 ENCOUNTER — Telehealth: Payer: Self-pay | Admitting: *Deleted

## 2014-12-30 LAB — URINE CULTURE: CULTURE: NO GROWTH

## 2015-01-02 NOTE — Patient Instructions (Addendum)
Kristina Phelps  01/02/2015   Your procedure is scheduled on: Tuesday 01/10/2015  Report to [facility] Main  Entrance take [place]  elevators to 3rd floor to  [place] at   [place] AM.  Call this number if you have problems the morning of surgery [phone]   Remember: ONLY 1 PERSON MAY GO WITH YOU TO SHORT STAY TO GET  READY MORNING OF [address].   Do not eat food or liquids :After Midnight.   FOLLOW A CLEAR LIQUID DIET THE DAY BEFORE SURGERY ON Monday 01/09/2015-ALL DAY , DO NOT DRINK ANY CARBONATED BEVERAGES! SEE LIST BELOW!   Take these medicines the morning of surgery with A SIP OF WATER: Nebivolol (Bystolic), Lacosamide (Vimpat)  DO NOT TAKE ANY DIABETIC MEDICATIONS DAY OF YOUR SURGERY                               You may not have any metal on your body including hair pins and              piercings  Do not wear jewelry, make-up, lotions, powders or perfumes, deodorant             Do not wear nail polish.  Do not shave  48 hours prior to surgery.              Men may shave face and neck.   Do not bring valuables to the hospital. [place].  Contacts, dentures or bridgework may not be worn into surgery.  Leave suitcase in the car. After surgery it may be brought to your room.     Patients discharged the day of surgery will not be allowed to drive home.  Name and phone number of your driver:  Special Instructions: N/A              Please read over the following fact sheets you were given: _____________________________________________________________________               CLEAR LIQUID DIET   Foods Allowed                                                                     Foods Excluded  Coffee and tea, regular and decaf                             liquids that you cannot  Plain Jell-O in any flavor                                             see through such as: Fruit ices  (not with fruit pulp)  milk, soups, orange juice  Iced Popsicles                                    All solid food Carbonated beverages, regular and diet                                    Cranberry, grape and apple juices Sports drinks like Gatorade Lightly seasoned clear broth or consume(fat free) Sugar, honey syrup  Sample Menu Breakfast                                Lunch                                     Supper Cranberry juice                    Beef broth                            Chicken broth Jell-O                                     Grape juice                           Apple juice Coffee or tea                        Jell-O                                      Popsicle                                                Coffee or tea                        Coffee or tea  _____________________________________________________________________  [facility] - Preparing for Surgery Before surgery, you can play an important role.  Because skin is not sterile, your skin needs to be as free of germs as possible.  You can reduce the number of germs on your skin by washing with CHG (chlorahexidine gluconate) soap before surgery.  CHG is an antiseptic cleaner which kills germs and bonds with the skin to continue killing germs even after washing. Please DO NOT use if you have an allergy to CHG or antibacterial soaps.  If your skin becomes reddened/irritated stop using the CHG and inform your nurse when you arrive at Short Stay. Do not shave (including legs and underarms) for at least 48 hours prior to the first CHG shower.  You may shave your face/neck. Please follow these instructions carefully:  1.  Shower with CHG Soap the night before surgery and the  morning of Surgery.  2.  If you choose to wash  your hair, wash your hair first as usual with your  normal  shampoo.  3.  After you shampoo, rinse your hair and body thoroughly to remove the  shampoo.                            4.  Use CHG as you would any other liquid soap.  You can apply chg directly  to the skin and wash                       Gently with a scrungie or clean washcloth.  5.  Apply the CHG Soap to your body ONLY FROM THE NECK DOWN.   Do not use on face/ open                           Wound or open sores. Avoid contact with eyes, ears mouth and genitals (private parts).                       Wash face,  Genitals (private parts) with your normal soap.             6.  Wash thoroughly, paying special attention to the area where your surgery  will be performed.  7.  Thoroughly rinse your body with warm water from the neck down.  8.  DO NOT shower/wash with your normal soap after using and rinsing off  the CHG Soap.                9.  Pat yourself dry with a clean towel.            10.  Wear clean pajamas.            11.  Place clean sheets on your bed the night of your first shower and do not  sleep with pets. Day of Surgery : Do not apply any lotions/deodorants the morning of surgery.  Please wear clean clothes to the hospital/surgery center.  FAILURE TO FOLLOW THESE INSTRUCTIONS MAY RESULT IN THE CANCELLATION OF YOUR SURGERY PATIENT SIGNATURE_________________________________  NURSE SIGNATURE__________________________________  ________________________________________________________________________   Adam Phenix  An incentive spirometer is a tool that can help keep your lungs clear and active. This tool measures how well you are filling your lungs with each breath. Taking long deep breaths may help reverse or decrease the chance of developing breathing (pulmonary) problems (especially infection) following:  A long period of time when you are unable to move or be active. BEFORE THE PROCEDURE   If the spirometer includes an indicator to show your best effort, your nurse or respiratory therapist will set it to a desired goal.  If possible, sit up straight or lean slightly  forward. Try not to slouch.  Hold the incentive spirometer in an upright position. INSTRUCTIONS FOR USE   Sit on the edge of your bed if possible, or sit up as far as you can in bed or on a chair.  Hold the incentive spirometer in an upright position.  Breathe out normally.  Place the mouthpiece in your mouth and seal your lips tightly around it.  Breathe in slowly and as deeply as possible, raising the piston or the ball toward the top of the column.  Hold your breath for 3-5 seconds or for as long as possible. Allow the piston or ball to fall to the bottom  of the column.  Remove the mouthpiece from your mouth and breathe out normally.  Rest for a few seconds and repeat Steps 1 through 7 at least 10 times every 1-2 hours when you are awake. Take your time and take a few normal breaths between deep breaths.  The spirometer may include an indicator to show your best effort. Use the indicator as a goal to work toward during each repetition.  After each set of 10 deep breaths, practice coughing to be sure your lungs are clear. If you have an incision (the cut made at the time of surgery), support your incision when coughing by placing a pillow or rolled up towels firmly against it. Once you are able to get out of bed, walk around indoors and cough well. You may stop using the incentive spirometer when instructed by your caregiver.  RISKS AND COMPLICATIONS  Take your time so you do not get dizzy or light-headed.  If you are in pain, you may need to take or ask for pain medication before doing incentive spirometry. It is harder to take a deep breath if you are having pain. AFTER USE  Rest and breathe slowly and easily.  It can be helpful to keep track of a log of your progress. Your caregiver can provide you with a simple table to help with this. If you are using the spirometer at home, follow these instructions: [place] IF:   You are having difficultly using the  spirometer.  You have trouble using the spirometer as often as instructed.  Your pain medication is not giving enough relief while using the spirometer.  You develop fever of 100.5 F (38.1 C) or higher. SEEK IMMEDIATE MEDICAL CARE IF:   You cough up bloody sputum that had not been present before.  You develop fever of 102 F (38.9 C) or greater.  You develop worsening pain at or near the incision site. MAKE SURE YOU:   Understand these instructions.  Will watch your condition.  Will get help right away if you are not doing well or get worse. Document Released: 06/10/2006 Document Revised: 04/22/2011 Document Reviewed: 08/11/2006 ExitCare Patient Information 2014 ExitCare, [place].   ________________________________________________________________________  WHAT IS A BLOOD TRANSFUSION? Blood Transfusion Information  A transfusion is the replacement of blood or some of its parts. Blood is made up of multiple cells which provide different functions.  Red blood cells carry oxygen and are used for blood loss replacement.  White blood cells fight against infection.  Platelets control bleeding.  Plasma helps clot blood.  Other blood products are available for specialized needs, such as hemophilia or other clotting disorders. BEFORE THE TRANSFUSION  Who gives blood for transfusions?   Healthy volunteers who are fully evaluated to make sure their blood is safe. This is blood bank blood. Transfusion therapy is the safest it has ever been in the practice of medicine. Before blood is taken from a donor, a complete history is taken to make sure that person has no history of diseases nor engages in risky social behavior (examples are intravenous drug use or sexual activity with multiple partners). The donor's travel history is screened to minimize risk of transmitting infections, such as malaria. The donated blood is tested for signs of infectious diseases, such as HIV and hepatitis.  The blood is then tested to be sure it is compatible with you in order to minimize the chance of a transfusion reaction. If you or a relative donates blood, this is  often done in anticipation of surgery and is not appropriate for emergency situations. It takes many days to process the donated blood. RISKS AND COMPLICATIONS Although transfusion therapy is very safe and saves many lives, the main dangers of transfusion include:   Getting an infectious disease.  Developing a transfusion reaction. This is an allergic reaction to something in the blood you were given. Every precaution is taken to prevent this. The decision to have a blood transfusion has been considered carefully by your caregiver before blood is given. Blood is not given unless the benefits outweigh the risks. AFTER THE TRANSFUSION  Right after receiving a blood transfusion, you will usually feel much better and more energetic. This is especially true if your red blood cells have gotten low (anemic). The transfusion raises the level of the red blood cells which carry oxygen, and this usually causes an energy increase.  The nurse administering the transfusion will monitor you carefully for complications. HOME CARE INSTRUCTIONS  No special instructions are needed after a transfusion. You may find your energy is better. Speak with your caregiver about any limitations on activity for underlying diseases you may have. SEEK MEDICAL CARE IF:   Your condition is not improving after your transfusion.  You develop redness or irritation at the intravenous (IV) site. SEEK IMMEDIATE MEDICAL CARE IF:  Any of the following symptoms occur over the next 12 hours:  Shaking chills.  You have a temperature by mouth above 102 F (38.9 C), not controlled by medicine.  Chest, back, or muscle pain.  People around you feel you are not acting correctly or are confused.  Shortness of breath or difficulty breathing.  Dizziness and fainting.  You  get a rash or develop hives.  You have a decrease in urine output.  Your urine turns a dark color or changes to pink, red, or brown. Any of the following symptoms occur over the next 10 days:  You have a temperature by mouth above 102 F (38.9 C), not controlled by medicine.  Shortness of breath.  Weakness after normal activity.  The white part of the eye turns yellow (jaundice).  You have a decrease in the amount of urine or are urinating less often.  Your urine turns a dark color or changes to pink, red, or brown. Document Released: 01/26/2000 Document Revised: 04/22/2011 Document Reviewed: 09/14/2007 [facility] Patient Information 2014 [place].

## 2015-01-04 ENCOUNTER — Encounter (HOSPITAL_COMMUNITY)
Admission: RE | Admit: 2015-01-04 | Discharge: 2015-01-04 | Disposition: A | Discharge status: Home / Self Care | Payer: Medicare Other | Source: Ambulatory Visit | Attending: Gynecologic Oncology | Admitting: Gynecologic Oncology

## 2015-01-04 ENCOUNTER — Encounter (HOSPITAL_COMMUNITY): Payer: Self-pay

## 2015-01-04 ENCOUNTER — Other Ambulatory Visit: Payer: Self-pay | Admitting: Gynecologic Oncology

## 2015-01-04 DIAGNOSIS — M79604 Pain in right leg: Secondary | ICD-10-CM

## 2015-01-04 DIAGNOSIS — R1909 Other intra-abdominal and pelvic swelling, mass and lump: Secondary | ICD-10-CM | POA: Insufficient documentation

## 2015-01-04 DIAGNOSIS — Z01818 Encounter for other preprocedural examination: Secondary | ICD-10-CM | POA: Insufficient documentation

## 2015-01-04 HISTORY — DX: Disorientation, unspecified: R41.0

## 2015-01-04 HISTORY — DX: Family history of other specified conditions: Z84.89

## 2015-01-04 LAB — COMPREHENSIVE METABOLIC PANEL
ALBUMIN: 3.3 g/dL — AB (ref 3.5–5.0)
ALK PHOS: 170 U/L — AB (ref 38–126)
ALT: 29 U/L (ref 14–54)
ANION GAP: 10 (ref 5–15)
AST: 59 U/L — AB (ref 15–41)
BILIRUBIN TOTAL: 0.2 mg/dL — AB (ref 0.3–1.2)
BUN: 18 mg/dL (ref 6–20)
CHLORIDE: 99 mmol/L — AB (ref 101–111)
CO2: 24 mmol/L (ref 22–32)
Calcium: 9.2 mg/dL (ref 8.9–10.3)
Creatinine, Ser: 0.68 mg/dL (ref 0.44–1.00)
GFR calc Af Amer: >60 mL/min (ref >60)
GLUCOSE: 97 mg/dL (ref 65–99)
POTASSIUM: 4.4 mmol/L (ref 3.5–5.1)
Sodium: 133 mmol/L — ABNORMAL LOW (ref 135–145)
Total Protein: 7.1 g/dL (ref 6.5–8.1)

## 2015-01-04 LAB — CBC WITH DIFFERENTIAL/PLATELET
BASOS PCT: 0 %
Basophils Absolute: 0 10*3/uL (ref 0.0–0.1)
Eosinophils Absolute: 0.1 10*3/uL (ref 0.0–0.7)
Eosinophils Relative: 1 %
HEMATOCRIT: 34.7 % — AB (ref 36.0–46.0)
HEMOGLOBIN: 10.8 g/dL — AB (ref 12.0–15.0)
LYMPHS PCT: 18 %
Lymphs Abs: 1.9 10*3/uL (ref 0.7–4.0)
MCH: 25.1 pg — ABNORMAL LOW (ref 26.0–34.0)
MCHC: 31.1 g/dL (ref 30.0–36.0)
MCV: 80.7 fL (ref 78.0–100.0)
MONOS PCT: 10 %
Monocytes Absolute: 1.1 10*3/uL — ABNORMAL HIGH (ref 0.1–1.0)
NEUTROS ABS: 7.9 10*3/uL — AB (ref 1.7–7.7)
NEUTROS PCT: 71 %
Platelets: 396 10*3/uL (ref 150–400)
RBC: 4.3 MIL/uL (ref 3.87–5.11)
RDW: 17.4 % — ABNORMAL HIGH (ref 11.5–15.5)
WBC: 11.1 10*3/uL — ABNORMAL HIGH (ref 4.0–10.5)

## 2015-01-04 LAB — URINALYSIS, ROUTINE W REFLEX MICROSCOPIC
BILIRUBIN URINE: NEGATIVE
Glucose, UA: NEGATIVE mg/dL
KETONES UR: NEGATIVE mg/dL
Leukocytes, UA: NEGATIVE
NITRITE: NEGATIVE
Protein, ur: NEGATIVE mg/dL
Specific Gravity, Urine: 1.017 (ref 1.005–1.030)
pH: 6 (ref 5.0–8.0)

## 2015-01-04 LAB — URINE MICROSCOPIC-ADD ON
Bacteria, UA: NONE SEEN
WBC UA: NONE SEEN WBC/hpf (ref 0–5)

## 2015-01-04 LAB — ABO/RH: ABO/RH(D): O POS

## 2015-01-04 MED ORDER — TRAMADOL HCL 50 MG PO TABS: 50.0000 mg | ORAL_TABLET | Freq: Four times a day (QID) | ORAL | Status: DC | PRN

## 2015-01-04 NOTE — Progress Notes (Signed)
Patient presented to the office without an appointment after her pre-operative appt today.  Her and her family had several questions about her upcoming surgery.  Printed out copies of previous AVS from last visit.  Reviewed the instructions for stopping xarelto tomorrow (last dose today).  Dr. Denman George made aware of the situation with the right leg pain.  Surgery discussed as well.  Tramadol given to use PRN if needed.  Swelling of the left upper arm is not a new finding per pt and family and stating it is lymphedema.  She has a wound on that arm that is being actively cared for at the wound center.  Site assessed with evidence of surrounding cellulitis.  Patient and family also advised that Dr. Denman George would like for her to drink one bottle of magnesium citrate the day before surgery in the afternoon to prep her bowels for surgery.  Verbalizing understanding.  All questions and concerns answered.  Husband stating they have selected [facility] in [place] for post-op recovery.  Advised to call for any questions or concerns.

## 2015-01-04 NOTE — Progress Notes (Signed)
   01/04/15 1205  OBSTRUCTIVE SLEEP APNEA  Have you ever been diagnosed with sleep apnea through a sleep study? No  Do you snore loudly (loud enough to be heard through closed doors)?  1  Do you often feel tired, fatigued, or sleepy during the daytime (such as falling asleep during driving or talking to someone)? 1  Has anyone observed you stop breathing during your sleep? 1  Do you have, or are you being treated for high blood pressure? 1  BMI more than 35 kg/m2? 0  Age > 50 (1-yes) 1  Neck circumference greater than:Female 16 inches or larger, Female 17inches or larger? 0  Female Gender (Yes=1) 0  Obstructive Sleep Apnea Score 5  Score 5 or greater  Results sent to PCP

## 2015-01-10 ENCOUNTER — Inpatient Hospital Stay (HOSPITAL_COMMUNITY): Payer: Medicare Other | Admitting: Anesthesiology

## 2015-01-10 ENCOUNTER — Inpatient Hospital Stay (HOSPITAL_COMMUNITY)
Admission: RE | Admit: 2015-01-10 | Discharge: 2015-01-17 | DRG: 746 | Disposition: A | Discharge status: Skilled Nursing Facility | Payer: Medicare Other | Source: Ambulatory Visit | Attending: Gynecologic Oncology | Admitting: Gynecologic Oncology

## 2015-01-10 ENCOUNTER — Encounter (HOSPITAL_COMMUNITY): Payer: Self-pay | Admitting: *Deleted

## 2015-01-10 ENCOUNTER — Encounter (HOSPITAL_COMMUNITY)
Admission: RE | Disposition: A | Discharge status: Skilled Nursing Facility | Payer: Self-pay | Source: Ambulatory Visit | Attending: Gynecologic Oncology

## 2015-01-10 DIAGNOSIS — C541 Malignant neoplasm of endometrium: Principal | ICD-10-CM | POA: Diagnosis present

## 2015-01-10 DIAGNOSIS — Z7901 Long term (current) use of anticoagulants: Secondary | ICD-10-CM | POA: Diagnosis not present

## 2015-01-10 DIAGNOSIS — G40909 Epilepsy, unspecified, not intractable, without status epilepticus: Secondary | ICD-10-CM | POA: Diagnosis present

## 2015-01-10 DIAGNOSIS — D638 Anemia in other chronic diseases classified elsewhere: Secondary | ICD-10-CM | POA: Diagnosis present

## 2015-01-10 DIAGNOSIS — I10 Essential (primary) hypertension: Secondary | ICD-10-CM | POA: Diagnosis present

## 2015-01-10 DIAGNOSIS — Z809 Family history of malignant neoplasm, unspecified: Secondary | ICD-10-CM | POA: Diagnosis not present

## 2015-01-10 DIAGNOSIS — E43 Unspecified severe protein-calorie malnutrition: Secondary | ICD-10-CM | POA: Diagnosis present

## 2015-01-10 DIAGNOSIS — Z01812 Encounter for preprocedural laboratory examination: Secondary | ICD-10-CM | POA: Diagnosis not present

## 2015-01-10 DIAGNOSIS — R140 Abdominal distension (gaseous): Secondary | ICD-10-CM

## 2015-01-10 DIAGNOSIS — F05 Delirium due to known physiological condition: Secondary | ICD-10-CM | POA: Diagnosis present

## 2015-01-10 DIAGNOSIS — R011 Cardiac murmur, unspecified: Secondary | ICD-10-CM | POA: Diagnosis present

## 2015-01-10 DIAGNOSIS — R1900 Intra-abdominal and pelvic swelling, mass and lump, unspecified site: Secondary | ICD-10-CM | POA: Diagnosis present

## 2015-01-10 DIAGNOSIS — Z853 Personal history of malignant neoplasm of breast: Secondary | ICD-10-CM | POA: Diagnosis not present

## 2015-01-10 DIAGNOSIS — S41102D Unspecified open wound of left upper arm, subsequent encounter: Secondary | ICD-10-CM

## 2015-01-10 DIAGNOSIS — Z96612 Presence of left artificial shoulder joint: Secondary | ICD-10-CM | POA: Diagnosis present

## 2015-01-10 DIAGNOSIS — R32 Unspecified urinary incontinence: Secondary | ICD-10-CM | POA: Diagnosis not present

## 2015-01-10 DIAGNOSIS — M179 Osteoarthritis of knee, unspecified: Secondary | ICD-10-CM | POA: Diagnosis present

## 2015-01-10 DIAGNOSIS — E785 Hyperlipidemia, unspecified: Secondary | ICD-10-CM | POA: Diagnosis present

## 2015-01-10 DIAGNOSIS — E875 Hyperkalemia: Secondary | ICD-10-CM | POA: Diagnosis present

## 2015-01-10 DIAGNOSIS — Z86718 Personal history of other venous thrombosis and embolism: Secondary | ICD-10-CM | POA: Diagnosis not present

## 2015-01-10 DIAGNOSIS — Z6825 Body mass index (BMI) 25.0-25.9, adult: Secondary | ICD-10-CM | POA: Diagnosis not present

## 2015-01-10 DIAGNOSIS — Z888 Allergy status to other drugs, medicaments and biological substances status: Secondary | ICD-10-CM | POA: Diagnosis not present

## 2015-01-10 DIAGNOSIS — L03114 Cellulitis of left upper limb: Secondary | ICD-10-CM | POA: Diagnosis present

## 2015-01-10 DIAGNOSIS — F419 Anxiety disorder, unspecified: Secondary | ICD-10-CM | POA: Diagnosis present

## 2015-01-10 DIAGNOSIS — Z7401 Bed confinement status: Secondary | ICD-10-CM | POA: Diagnosis not present

## 2015-01-10 DIAGNOSIS — R062 Wheezing: Secondary | ICD-10-CM

## 2015-01-10 DIAGNOSIS — N134 Hydroureter: Secondary | ICD-10-CM | POA: Diagnosis present

## 2015-01-10 DIAGNOSIS — K567 Ileus, unspecified: Secondary | ICD-10-CM | POA: Diagnosis not present

## 2015-01-10 DIAGNOSIS — K9189 Other postprocedural complications and disorders of digestive system: Secondary | ICD-10-CM

## 2015-01-10 DIAGNOSIS — I82401 Acute embolism and thrombosis of unspecified deep veins of right lower extremity: Secondary | ICD-10-CM

## 2015-01-10 DIAGNOSIS — Z79899 Other long term (current) drug therapy: Secondary | ICD-10-CM | POA: Diagnosis not present

## 2015-01-10 DIAGNOSIS — E871 Hypo-osmolality and hyponatremia: Secondary | ICD-10-CM | POA: Diagnosis present

## 2015-01-10 DIAGNOSIS — Z8673 Personal history of transient ischemic attack (TIA), and cerebral infarction without residual deficits: Secondary | ICD-10-CM | POA: Diagnosis not present

## 2015-01-10 DIAGNOSIS — G40409 Other generalized epilepsy and epileptic syndromes, not intractable, without status epilepticus: Secondary | ICD-10-CM

## 2015-01-10 DIAGNOSIS — X58XXXD Exposure to other specified factors, subsequent encounter: Secondary | ICD-10-CM | POA: Diagnosis present

## 2015-01-10 DIAGNOSIS — D649 Anemia, unspecified: Secondary | ICD-10-CM | POA: Diagnosis present

## 2015-01-10 DIAGNOSIS — F0390 Unspecified dementia without behavioral disturbance: Secondary | ICD-10-CM | POA: Diagnosis present

## 2015-01-10 HISTORY — PX: LAPAROTOMY: SHX154

## 2015-01-10 LAB — TYPE AND SCREEN
ABO/RH(D): O POS
Antibody Screen: NEGATIVE

## 2015-01-10 SURGERY — LAPAROTOMY, EXPLORATORY
Anesthesia: General | Site: Abdomen | Laterality: Bilateral

## 2015-01-10 MED ORDER — ONDANSETRON HCL 4 MG/2ML IJ SOLN
4.0000 mg | Freq: Four times a day (QID) | INTRAMUSCULAR | Status: DC | PRN
  Administered 2015-01-11 – 2015-01-15 (×6): 4 mg via INTRAVENOUS
  Filled 2015-01-10 (×7): qty 2

## 2015-01-10 MED ORDER — 0.9 % SODIUM CHLORIDE (POUR BTL) OPTIME
TOPICAL | Status: DC | PRN
  Administered 2015-01-10: 2000 mL

## 2015-01-10 MED ORDER — ROCURONIUM BROMIDE 100 MG/10ML IV SOLN
INTRAVENOUS | Status: AC
  Filled 2015-01-10: qty 1

## 2015-01-10 MED ORDER — HYDROMORPHONE HCL 1 MG/ML IJ SOLN
0.2500 mg | INTRAMUSCULAR | Status: DC | PRN
  Administered 2015-01-10 (×2): 0.5 mg via INTRAVENOUS

## 2015-01-10 MED ORDER — SODIUM CHLORIDE 0.9 % IJ SOLN
INTRAMUSCULAR | Status: AC
  Filled 2015-01-10: qty 10

## 2015-01-10 MED ORDER — FENTANYL CITRATE (PF) 100 MCG/2ML IJ SOLN
INTRAMUSCULAR | Status: DC | PRN
  Administered 2015-01-10: 50 ug via INTRAVENOUS
  Administered 2015-01-10 (×2): 25 ug via INTRAVENOUS
  Administered 2015-01-10 (×2): 50 ug via INTRAVENOUS
  Administered 2015-01-10: 25 ug via INTRAVENOUS

## 2015-01-10 MED ORDER — TRAMADOL HCL 50 MG PO TABS
100.0000 mg | ORAL_TABLET | Freq: Two times a day (BID) | ORAL | Status: DC
  Administered 2015-01-10 – 2015-01-11 (×3): 100 mg via ORAL
  Filled 2015-01-10 (×5): qty 2

## 2015-01-10 MED ORDER — ACETAMINOPHEN 500 MG PO TABS
1000.0000 mg | ORAL_TABLET | Freq: Two times a day (BID) | ORAL | Status: DC
  Administered 2015-01-10 – 2015-01-11 (×3): 1000 mg via ORAL
  Filled 2015-01-10 (×7): qty 2

## 2015-01-10 MED ORDER — OXYCODONE HCL 5 MG PO TABS: 5.0000 mg | ORAL_TABLET | Freq: Once | ORAL | Status: DC | PRN

## 2015-01-10 MED ORDER — FENTANYL CITRATE (PF) 250 MCG/5ML IJ SOLN
INTRAMUSCULAR | Status: AC
  Filled 2015-01-10: qty 5

## 2015-01-10 MED ORDER — DEXAMETHASONE SODIUM PHOSPHATE 10 MG/ML IJ SOLN
INTRAMUSCULAR | Status: DC | PRN
  Administered 2015-01-10: 4 mg via INTRAVENOUS

## 2015-01-10 MED ORDER — PROPOFOL 10 MG/ML IV BOLUS
INTRAVENOUS | Status: DC | PRN
  Administered 2015-01-10: 100 mg via INTRAVENOUS

## 2015-01-10 MED ORDER — LIDOCAINE HCL (CARDIAC) 20 MG/ML IV SOLN
INTRAVENOUS | Status: DC | PRN
  Administered 2015-01-10: 40 mg via INTRAVENOUS

## 2015-01-10 MED ORDER — SUGAMMADEX SODIUM 200 MG/2ML IV SOLN
INTRAVENOUS | Status: AC
  Filled 2015-01-10: qty 2

## 2015-01-10 MED ORDER — PHENYLEPHRINE HCL 10 MG/ML IJ SOLN
INTRAMUSCULAR | Status: DC | PRN
  Administered 2015-01-10 (×3): 40 ug via INTRAVENOUS

## 2015-01-10 MED ORDER — SUGAMMADEX SODIUM 200 MG/2ML IV SOLN
INTRAVENOUS | Status: DC | PRN
  Administered 2015-01-10: 200 mg via INTRAVENOUS

## 2015-01-10 MED ORDER — LACTATED RINGERS IV SOLN
INTRAVENOUS | Status: DC | PRN
  Administered 2015-01-10 (×2): via INTRAVENOUS

## 2015-01-10 MED ORDER — PHENYLEPHRINE 40 MCG/ML (10ML) SYRINGE FOR IV PUSH (FOR BLOOD PRESSURE SUPPORT)
PREFILLED_SYRINGE | INTRAVENOUS | Status: AC
Start: 2015-01-10 — End: 2015-01-10
  Filled 2015-01-10: qty 10

## 2015-01-10 MED ORDER — ENOXAPARIN SODIUM 40 MG/0.4ML ~~LOC~~ SOLN
40.0000 mg | SUBCUTANEOUS | Status: DC
  Administered 2015-01-11 – 2015-01-16 (×6): 40 mg via SUBCUTANEOUS
  Filled 2015-01-10 (×7): qty 0.4

## 2015-01-10 MED ORDER — CEFOXITIN SODIUM 2 G IV SOLR
INTRAVENOUS | Status: AC
  Filled 2015-01-10: qty 2

## 2015-01-10 MED ORDER — BUPIVACAINE LIPOSOME 1.3 % IJ SUSP
INTRAMUSCULAR | Status: DC | PRN
  Administered 2015-01-10: 20 mL

## 2015-01-10 MED ORDER — ONDANSETRON HCL 4 MG/2ML IJ SOLN
INTRAMUSCULAR | Status: AC
  Filled 2015-01-10: qty 2

## 2015-01-10 MED ORDER — HYDROMORPHONE HCL 1 MG/ML IJ SOLN
INTRAMUSCULAR | Status: AC
  Filled 2015-01-10: qty 1

## 2015-01-10 MED ORDER — BUPIVACAINE LIPOSOME 1.3 % IJ SUSP
20.0000 mL | Freq: Once | INTRAMUSCULAR | Status: DC
  Filled 2015-01-10: qty 20

## 2015-01-10 MED ORDER — ONDANSETRON HCL 4 MG PO TABS: 4.0000 mg | ORAL_TABLET | Freq: Four times a day (QID) | ORAL | Status: DC | PRN

## 2015-01-10 MED ORDER — LACOSAMIDE 50 MG PO TABS
200.0000 mg | ORAL_TABLET | Freq: Two times a day (BID) | ORAL | Status: DC
  Administered 2015-01-10 – 2015-01-11 (×3): 200 mg via ORAL
  Filled 2015-01-10 (×3): qty 4

## 2015-01-10 MED ORDER — LOSARTAN POTASSIUM 50 MG PO TABS
100.0000 mg | ORAL_TABLET | Freq: Every day | ORAL | Status: DC
  Filled 2015-01-10 (×2): qty 2

## 2015-01-10 MED ORDER — ONDANSETRON HCL 4 MG/2ML IJ SOLN
INTRAMUSCULAR | Status: DC | PRN
  Administered 2015-01-10: 4 mg via INTRAVENOUS

## 2015-01-10 MED ORDER — SODIUM CHLORIDE 0.9 % IJ SOLN
INTRAMUSCULAR | Status: AC
  Filled 2015-01-10: qty 20

## 2015-01-10 MED ORDER — ENSURE ENLIVE PO LIQD
237.0000 mL | Freq: Two times a day (BID) | ORAL | Status: DC
  Administered 2015-01-10 – 2015-01-12 (×2): 237 mL via ORAL

## 2015-01-10 MED ORDER — PROPOFOL 10 MG/ML IV BOLUS
INTRAVENOUS | Status: AC
  Filled 2015-01-10: qty 20

## 2015-01-10 MED ORDER — ROCURONIUM BROMIDE 100 MG/10ML IV SOLN
INTRAVENOUS | Status: DC | PRN
  Administered 2015-01-10: 10 mg via INTRAVENOUS
  Administered 2015-01-10: 40 mg via INTRAVENOUS

## 2015-01-10 MED ORDER — NEBIVOLOL HCL 10 MG PO TABS
10.0000 mg | ORAL_TABLET | Freq: Every day | ORAL | Status: DC
  Filled 2015-01-10 (×2): qty 1

## 2015-01-10 MED ORDER — OXYCODONE HCL 5 MG PO TABS
5.0000 mg | ORAL_TABLET | ORAL | Status: DC | PRN
  Administered 2015-01-11 (×3): 5 mg via ORAL
  Filled 2015-01-10 (×3): qty 1

## 2015-01-10 MED ORDER — CEFOXITIN SODIUM 2 G IV SOLR
2.0000 g | INTRAVENOUS | Status: AC
  Administered 2015-01-10: 2 g via INTRAVENOUS

## 2015-01-10 MED ORDER — ONDANSETRON HCL 4 MG/2ML IJ SOLN: 4.0000 mg | Freq: Four times a day (QID) | INTRAMUSCULAR | Status: DC | PRN

## 2015-01-10 MED ORDER — LIDOCAINE HCL (CARDIAC) 20 MG/ML IV SOLN
INTRAVENOUS | Status: AC
  Filled 2015-01-10: qty 5

## 2015-01-10 MED ORDER — SIMVASTATIN 20 MG PO TABS
20.0000 mg | ORAL_TABLET | Freq: Every day | ORAL | Status: DC
  Administered 2015-01-10 – 2015-01-11 (×2): 20 mg via ORAL
  Filled 2015-01-10 (×3): qty 1

## 2015-01-10 MED ORDER — OXYCODONE HCL 5 MG/5ML PO SOLN
5.0000 mg | Freq: Once | ORAL | Status: DC | PRN
  Filled 2015-01-10: qty 5

## 2015-01-10 MED ORDER — EPHEDRINE SULFATE 50 MG/ML IJ SOLN
INTRAMUSCULAR | Status: AC
  Filled 2015-01-10: qty 1

## 2015-01-10 MED ORDER — KCL IN DEXTROSE-NACL 20-5-0.45 MEQ/L-%-% IV SOLN
INTRAVENOUS | Status: DC
Start: 2015-01-10 — End: 2015-01-11
  Administered 2015-01-10: 13:00:00 via INTRAVENOUS
  Filled 2015-01-10 (×2): qty 1000

## 2015-01-10 MED ORDER — DEXAMETHASONE SODIUM PHOSPHATE 10 MG/ML IJ SOLN
INTRAMUSCULAR | Status: AC
  Filled 2015-01-10: qty 1

## 2015-01-10 SURGICAL SUPPLY — 39 items
ATTRACTOMAT 16X20 MAGNETIC DRP (DRAPES) IMPLANT
CHLORAPREP W/TINT 26ML (MISCELLANEOUS) ×2 IMPLANT
CLIP TI MEDIUM LARGE 6 (CLIP) IMPLANT
CONT SPEC 4OZ CLIKSEAL STRL BL (MISCELLANEOUS) IMPLANT
COVER SURGICAL LIGHT HANDLE (MISCELLANEOUS) ×2 IMPLANT
DRAPE UTILITY 15X26 (DRAPE) ×2 IMPLANT
DRAPE WARM FLUID 44X44 (DRAPE) ×2 IMPLANT
DRESSING TELFA ISLAND 4X8 (GAUZE/BANDAGES/DRESSINGS) IMPLANT
DRSG OPSITE POSTOP 4X10 (GAUZE/BANDAGES/DRESSINGS) ×2 IMPLANT
ELECT LIGASURE SHORT 9 REUSE (ELECTRODE) IMPLANT
ELECT REM PT RETURN 9FT ADLT (ELECTROSURGICAL) ×2
ELECTRODE REM PT RTRN 9FT ADLT (ELECTROSURGICAL) ×1 IMPLANT
GAUZE SPONGE 4X4 12PLY STRL (GAUZE/BANDAGES/DRESSINGS) IMPLANT
GAUZE SPONGE 4X4 16PLY XRAY LF (GAUZE/BANDAGES/DRESSINGS) IMPLANT
GLOVE BIO SURGEON STRL SZ 6 (GLOVE) ×4 IMPLANT
GLOVE BIO SURGEON STRL SZ 6.5 (GLOVE) ×4 IMPLANT
GOWN STRL REUS W/ TWL LRG LVL3 (GOWN DISPOSABLE) ×2 IMPLANT
GOWN STRL REUS W/TWL LRG LVL3 (GOWN DISPOSABLE) ×2
KIT BASIN OR (CUSTOM PROCEDURE TRAY) ×2 IMPLANT
LIQUID BAND (GAUZE/BANDAGES/DRESSINGS) ×2 IMPLANT
LOOP VESSEL MAXI BLUE (MISCELLANEOUS) IMPLANT
NEEDLE HYPO 22GX1.5 SAFETY (NEEDLE) ×4 IMPLANT
NS IRRIG 1000ML POUR BTL (IV SOLUTION) IMPLANT
PACK GENERAL/GYN (CUSTOM PROCEDURE TRAY) ×2 IMPLANT
SHEET LAVH (DRAPES) ×2 IMPLANT
SPONGE LAP 18X18 X RAY DECT (DISPOSABLE) IMPLANT
STAPLER VISISTAT 35W (STAPLE) IMPLANT
SUT MNCRL AB 4-0 PS2 18 (SUTURE) ×4 IMPLANT
SUT PDS AB 1 TP1 96 (SUTURE) ×4 IMPLANT
SUT VIC AB 0 CT1 36 (SUTURE) IMPLANT
SUT VIC AB 2-0 CT1 36 (SUTURE) ×4 IMPLANT
SUT VIC AB 2-0 CT2 27 (SUTURE) IMPLANT
SUT VIC AB 3-0 CTX 36 (SUTURE) IMPLANT
SYR 20CC LL (SYRINGE) ×4 IMPLANT
TOWEL OR 17X26 10 PK STRL BLUE (TOWEL DISPOSABLE) ×2 IMPLANT
TOWEL OR NON WOVEN STRL DISP B (DISPOSABLE) ×2 IMPLANT
TRAY FOLEY W/METER SILVER 14FR (SET/KITS/TRAYS/PACK) ×2 IMPLANT
TRAY FOLEY W/METER SILVER 16FR (SET/KITS/TRAYS/PACK) IMPLANT
WATER STERILE IRR 1500ML POUR (IV SOLUTION) IMPLANT

## 2015-01-10 NOTE — Anesthesia Postprocedure Evaluation (Signed)
Anesthesia Post Note  Patient: Kristina Phelps  Procedure(s) Performed: Procedure(s) (LRB): EXPLORATORY LAPAROTOMY ,BIOPSY OF PELVIC MASS (Bilateral)  Patient location during evaluation: PACU Anesthesia Type: General Level of consciousness: awake and alert and patient cooperative Pain management: pain level controlled Vital Signs Assessment: post-procedure vital signs reviewed and stable Respiratory status: spontaneous breathing and respiratory function stable Cardiovascular status: stable Anesthetic complications: no    Last Vitals:  Filed Vitals:   01/10/15 1000 01/10/15 1015  BP: 125/67 110/73  Pulse: 80 81  Temp:  36.8 C  Resp: 18 19    Last Pain:  Filed Vitals:   01/10/15 1024  PainSc: [place]

## 2015-01-10 NOTE — Anesthesia Procedure Notes (Signed)
Procedure Name: Intubation Date/Time: 01/10/2015 7:45 AM Performed by: Glory Buff Pre-anesthesia Checklist: Patient identified, Emergency Drugs available, Suction available and Patient being monitored Patient Re-evaluated:Patient Re-evaluated prior to inductionOxygen Delivery Method: Circle System Utilized Preoxygenation: Pre-oxygenation with 100% oxygen Intubation Type: IV induction Ventilation: Mask ventilation without difficulty Laryngoscope Size: Miller and 3 Grade View: Grade I Tube type: Oral Number of attempts: 1 Airway Equipment and Method: Stylet and Oral airway Placement Confirmation: ETT inserted through vocal cords under direct vision,  positive ETCO2 and breath sounds checked- equal and bilateral Secured at: 20 cm Tube secured with: Tape Dental Injury: Teeth and Oropharynx as per pre-operative assessment

## 2015-01-10 NOTE — Interval H&P Note (Signed)
History and Physical Interval Note:  01/10/2015 7:28 AM  Kristina Phelps  has presented today for surgery, with the diagnosis of PELVIC MASS   The various methods of treatment have been discussed with the patient and family. After consideration of risks, benefits and other options for treatment, the patient has consented to  Procedure(s): EXPLORATORY LAPAROTOMY BILATERAL SALPINGO OOPHORECTOMY DEBULKING POSSIBLE HYSTERECTOMY AND POSSIBLE BOWEL RESECTION  (Bilateral) as a surgical intervention .  The patient's history has been reviewed, patient examined, no change in status, stable for surgery.  I have reviewed the patient's chart and labs.  Questions were answered to the patient's satisfaction.     The patient has delerium. She is oriented to place and person but not time. She does not remember me from our office visit a couple of weeks ago. Her labs reflect severe malnutrition and anemia. I discussed with the patient and her family (niece and husband) that I do not believe that she has the underlying medical status to tolerate an extremely radical procedure. Therefore the primary goal of the surgery will be to establish diagnosis of the mass. If it is not involving "vital structures" and resection is likely to be straightforward with low risk for morbidity, we will attempt this. However, if it appears that GI, GU resection and reconstruction is necessary in order to resect the mass, or high EBL would likely be encountered (in this patient with hx of anticoagulant use and recent DVT), then we will obtain a tissue sample for histology and not proceed with resection. Once we confirm the histology of the lesion, we would proceed with alternative non-surgical options in that scenario.  I discussed that given her poor baseline functional status (ECOG PF 3) she will require going to a nursing home at discharge as her husband is unable to care for her well at present and her needs will be elevated after  surgery. They have chosen a location they would prefer.  Donaciano Eva

## 2015-01-10 NOTE — Anesthesia Preprocedure Evaluation (Addendum)
Anesthesia Evaluation  Patient identified by MRN, date of birth, ID band Patient awake    Reviewed: Allergy & Precautions, NPO status , Patient's Chart, lab work & pertinent test results  History of Anesthesia Complications (+) PONV and Family history of anesthesia reaction  Airway Mallampati: II   Neck ROM: full    Dental   Pulmonary    breath sounds clear to auscultation       Cardiovascular hypertension, + Peripheral Vascular Disease and + DVT   Rhythm:regular Rate:Normal     Neuro/Psych  Headaches, Seizures -,  Anxiety    GI/Hepatic   Endo/Other    Renal/GU      Musculoskeletal  (+) Arthritis ,   Abdominal   Peds  Hematology   Anesthesia Other Findings   Reproductive/Obstetrics                            Anesthesia Physical Anesthesia Plan  ASA: III  Anesthesia Plan: General   Post-op Pain Management:    Induction: Intravenous  Airway Management Planned: Oral ETT  Additional Equipment:   Intra-op Plan:   Post-operative Plan: Extubation in OR  Informed Consent: I have reviewed the patients History and Physical, chart, labs and discussed the procedure including the risks, benefits and alternatives for the proposed anesthesia with the patient or authorized representative who has indicated his/her understanding and acceptance.     Plan Discussed with: CRNA, Anesthesiologist and Surgeon  Anesthesia Plan Comments:         Anesthesia Quick Evaluation

## 2015-01-10 NOTE — H&P (View-Only) (Signed)
Consult Note: Gyn-Onc  Consult was requested by Dr. Bass for the evaluation of Antonio B Schmaltz 79 y.o. female with a large pelvic  CC:  Chief Complaint  Patient presents with  . pelvic mass    New consultation    Assessment/Plan:  Ms. Macie B Pae  is a 79 y.o.  year old with a large (20cm) complex, mostly solid left abdomino-pelvic mass in the setting of poor performance status (ECOG 2-3) and dementia, an acute DVT.  I performed a history, physical examination, and personally reviewed the patient's imaging films including the CT scan of the abdomen and pelvis from 12/01/14 from [facility]. The images show a large (20cm) irregular, mostly solid mass from the pelvis (likely left ovary or uterus) extending into the abdomen. It is causing left hydroureter. There is no ascites and no other signs of metastatic ovarian cancer.  I do not know definitively if this is a malignancy, and recommend surgery to confirm its etiology. However, if this is a malignancy, it is unlikely she will be a good candidate for aggressive adjuvant therapy due to her poor underlying health and performance status.  She is at a high surgical risk given her anticoagulation status from her DVT and her poor performance status.  She will need preoperative clearance and optimization from her PCP, Dr Penner. She will need to hold her Xarelto held 5 days preop. She will need placement of an IVC filter by IR preop. She will need facilitation of placement to a nursing home postop as she is clearly not doing well at home independently with her husband, and will need care and assistance more than he can offer postop.  I discussed with Yalissa and her husband that surgery will include an exploratory laparotomy, bilateral salpingo-oophorectomy, debulking of pelvic mass, possible hysterectomy, possible bowel resection. I discussed with him that this is a major abdominal surgery with high risk particularly for Yittel. I  discussed that this mass is causing compression of her GI and GU system and therefore reconstruction of these organs may be necessary as part of surgery. I discussed that she is at particularly high risk for bleeding complication perioperatively and for needing a blood transfusion. We will type and cross her for 2 units perioperatively. I discussed that she is at high-risk for prolonged hospitalization, reoperation, and perioperative death.  I discussed that she will certainly need to have placement postoperatively in a nursing home or rehab due to her poor performance status.  Her niece has POA but the patient can legally sign consent.  HPI: Sherley Moseman is a 79 year old G0 who is seen in consultation at the request of Dr Bass for a large abdomino-pelvic mass. The patient, who lives in [place], has a history of a DVT in the right lower extremity and a proximally September 2016. This was managed with prescription of Xarelto. She then developed symptoms of urinary leakage with urge incontinence. She was evaluated at [facility] on 12/01/2014 for symptoms of abdominal swelling and a palpable mass. A CT scan of the abdomen and pelvis was performed on that day and revealed a 4 mm nodule in the right middle lobe of the lung. Mild left hydroureter ureter nephrosis. A large heterogeneously enhancing pelvic mass measuring 20.7 x 14.7 x 14 cm exerting mass effect on all adjacent structures, displacing the urinary bladder, displacing the rectum posteriorly, displacing the sigmoid colon cephalad. Was unclear if this originated from the left ovary or uterus. It was felt to be   displacing adjacent structures rather than invading adjacent structures. There was no ascites visible, no carcinomatosis, no omental caking identified. The patient had a left ureteral stent placed by urology which was later expelled spontaneously approximate 1 week later.  During these recent hospitalizations the patient experienced a  fall. She states that she hit her head and CT scans were done with no abnormalities identified. Since that time she's had difficulty ambulating and the patient is effectively bedbound. She lives with her husband who is also elderly. They have no immediate family support. She is unable to ambulate independently.  She has personal medical history of hyperlipidemia, and epilepsy. She does not carry a formal diagnosis but appears to have issues with dementia where she admits to forgetting things easily. She cannot provide a medical history for clinicians. She has a remote history of breast cancer 1995 treated with surgery and radiation from which she has long-term left upper extremity lymphedema. Dr. Penner is her primary care doctor in [place].  Current Meds:  Outpatient Encounter Prescriptions as of 12/19/2014  Medication Sig  . lacosamide (VIMPAT) 200 MG TABS tablet Take 200 mg by mouth 2 (two) times daily.  . losartan-hydrochlorothiazide (HYZAAR) 100-12.5 MG per tablet Take 1 tablet by mouth daily.  . nebivolol (BYSTOLIC) 10 MG tablet Take 10 mg by mouth daily.  . rivaroxaban (XARELTO) 20 MG TABS tablet Take 20 mg by mouth daily with supper.  . simvastatin (ZOCOR) 20 MG tablet Take 20 mg by mouth at bedtime.  . [DISCONTINUED] methocarbamol (ROBAXIN) 500 MG tablet Take 1 tablet (500 mg total) by mouth 3 (three) times daily as needed for muscle spasms.  . aspirin 325 MG tablet Take 650 mg by mouth daily as needed for mild pain or headache.  . methocarbamol (ROBAXIN) 500 MG tablet Take 1 tablet (500 mg total) by mouth 3 (three) times daily as needed for muscle spasms.  . [DISCONTINUED] oxyCODONE-acetaminophen (PERCOCET) 5-325 MG per tablet Take 1-2 tablets by mouth every 4 (four) hours as needed.  . [DISCONTINUED] oxyCODONE-acetaminophen (PERCOCET) 5-325 MG per tablet Take 1-2 tablets by mouth every 4 (four) hours as needed.  . [DISCONTINUED] oxyCODONE-acetaminophen (PERCOCET/ROXICET) 5-325 MG per  tablet Take 1 tablet by mouth every 4 (four) hours as needed for severe pain.  . [DISCONTINUED] phenytoin (DILANTIN) 100 MG ER capsule Take 100 mg by mouth See admin instructions. Every 8 hours.  Take at 8am and 4pm and 12midnight   No facility-administered encounter medications on file as of 12/19/2014.    Allergy:  Allergies  Allergen Reactions  . Depakote [Divalproex Sodium] Nausea And Vomiting  . Nexium [Esomeprazole Magnesium] Nausea And Vomiting  . Valium [Diazepam]     "Too nervous"    Social Hx:   Social History   Social History  . Marital Status: Married    Spouse Name: N/A  . Number of Children: N/A  . Years of Education: N/A   Occupational History  . Not on file.   Social History Main Topics  . Smoking status: Never Smoker   . Smokeless tobacco: Not on file  . Alcohol Use: No  . Drug Use: No  . Sexual Activity: Not on file   Other Topics Concern  . Not on file   Social History Narrative    Past Surgical Hx:  Past Surgical History  Procedure Laterality Date  . Joint replacement Left     shoulder  . Breast surgery Left 89    lumpectomy  . Eye surgery Bilateral       cataracts  . Wrist fracture surgery Bilateral     right has plate in place  . Reverse shoulder arthroplasty Left 06/22/2013    Procedure: LEFT SHOULDER OPEN REDUCTION INTERNAL FIXATION ;  Surgeon: Kevin M Supple, MD;  Location: [facility] OR;  Service: Orthopedics;  Laterality: Left;    Past Medical Hx:  Past Medical History  Diagnosis Date  . PONV (postoperative nausea and vomiting)   . Hypertension   . Heart murmur   . Hx-TIA (transient ischemic attack)   . Seizures (HCC)     tuesday week ago had last seizure/ grand mal  . Headache(784.0)   . Arthritis     knee  . Cancer (HCC)     breast l  . Anxiety     Past Gynecological History:  G0  No LMP recorded. Patient is postmenopausal.  Family Hx:  Family History  Problem Relation Age of Onset  . Cancer Mother     Review of  Systems:  Constitutional  Feels weak  ENT Normal appearing ears and nares bilaterally Skin/Breast  No rash, jaundice, itching, dryness, + left upper extremity wounds that have been erupting from her anterior left arm Cardiovascular  No chest pain, shortness of breath, or edema  Pulmonary  No cough or wheeze.  Gastro Intestinal  No nausea, vomitting, or diarrhoea. No bright red blood per rectum, no abdominal pain, change in bowel movement, or constipation.  Genito Urinary  No frequency, urgency, dysuria, denies post menopausal bleeding Musculo Skeletal  No myalgia, arthralgia, joint swelling or pain  Neurologic  + weakness, + change in gait, unstable, falls easily  Psychology  No depression, anxiety, insomnia.   Vitals:  Blood pressure 142/80, pulse 89, temperature 97.5 F (36.4 C), temperature source Oral, resp. rate 18, height 5' 5" (1.651 m), weight 149 lb 3.2 oz (67.677 kg), SpO2 99 %.  Physical Exam: WD in NAD Neck  Supple NROM, without any enlargements.  Lymph Node Survey No cervical supraclavicular or inguinal adenopathy Cardiovascular  Pulse normal rate, regularity and rhythm. S1 and S2 normal.  Lungs  Clear to auscultation bilateraly, without wheezes/crackles/rhonchi. Good air movement.  Skin  No rash/lesions/breakdown  Psychiatry  Alert and oriented to person, place, and time. Unable to provide a medical history - poor short term memory Abdomen  Normoactive bowel sounds, abdomen soft, non-tender and overweight without evidence of hernia. Palpable firm minimally mobile irregular mass filling lower abdomen below level of umbilicus (nontender) Back No CVA tenderness. Hunched over and extremely kyphotic Genito Urinary  Vulva/vagina: Normal external female genitalia.  No lesions. No discharge or bleeding.  Bladder/urethra:  No lesions or masses, well supported bladder  Vagina: grossly normal  Cervix: unable to visulaize secondary to anterior displacement. However,  palpably normal.  Uterus: appears to be Small, mobile, no parametrial involvement or nodularity. Anteriorally displaced  Adnexa: large, minimally mobile, fixed, irregular, solid mass filling pelvis side to side and pressing against rectum but without obvious infiltration. Nodular. Extends into abdomen. Rectal  No clear cul de sac nodularity, but irregular mass compresses rectum posteriorally.  Extremities  No bilateral cyanosis, clubbing or edema.   Joliyah Lippens Caroline, MD  12/19/2014, 10:38 AM      

## 2015-01-10 NOTE — Transfer of Care (Signed)
Immediate Anesthesia Transfer of Care Note  Patient: Kristina Phelps  Procedure(s) Performed: Procedure(s): EXPLORATORY LAPAROTOMY ,BIOPSY OF PELVIC MASS (Bilateral)  Patient Location: PACU  Anesthesia Type:General  Level of Consciousness: awake, alert  and oriented  Airway & Oxygen Therapy: Patient Spontanous Breathing and Patient connected to face mask oxygen  Post-op Assessment: Report given to RN and Post -op Vital signs reviewed and stable  Post vital signs: Reviewed and stable  Last Vitals:  Filed Vitals:   01/10/15 0609  BP: 126/74  Pulse: 90  Temp: 36.6 C  Resp: 16    Complications: No apparent anesthesia complications

## 2015-01-10 NOTE — Op Note (Signed)
OPERATIVE NOTE  01/10/15  Preoperative Diagnosis: 1. Pelvic mass 2. delerium 3. Severe malnutrition   Postoperative Diagnosis: poorly differentiated malignancy of unknown primary    Procedure(s) Performed: 1. Exploratory laparotomy with biopsy of pelvic mass  Surgeon: Thereasa Solo, MD.  Assistant Surgeon: Dr Lahoma Crocker, M.D. Assistant: (an MD assistant was necessary for tissue manipulation, retraction and positioning due to the complexity of the case and hospital policies).   Specimens: washings, pelvic mass biopsy    Estimated Blood Loss: 100 mL.    Urine [id number]  Complications: None.   Operative Findings: Anasarca in the subcutaneous tissues. The pelvis was filled with a 20 cm solid, fleshy, friable and soft pelvic tumor that encased and encompassed bilateral tubes and ovaries (no identifiable gynecologic structures were identified.). The mass displaced sigmoid colon superiorly and medially towards the right pelvic sidewall. There is no apparent infiltration into the rectum or sigmoid a primary involvement of these colonic structures, or no signs of gastrointestinal obstruction at the colonic or small intestinal level. There was dense involvement and adhesion to the left pelvic sidewall (is possible that this mass originated from the left adnexal structures or ovary in a retroperitoneal fashion) and dense involvement of the bladder anteriorly. There was minimal capacity for bladder distention given the location of the mass and the obliteration of the anterior pelvis by the mass. The cecum and appendix were normal. The omentum was grossly normal with no nodularity. The transverse and ascending colon was normal. There was no nodularity or masses or abnormalities in the small intestine including no evidence of dilation or obstruction. There is no palpable retroperitoneal adenopathy. This was limited in the pelvis due to the mass. The diaphragms and liver surface was smooth with no  palpable tumor.  There was approximately 200 mL of free pelvic and abdominal fluid encountered at opening of the abdomen. The tumor extended posteriorly to fill the posterior cul-de-sac with only adhesions. However there were dense adhesions between the mass and the sacral promontory. The tumor was fleshy and easily broke apart with gentle manipulation. This fragmented tissue from the tumor was sent for frozen section which revealed a poorly differentiated malignancy (unable to testing which carcinoma from sarcoma).  Due to the patient's poor underlying medical condition and extensive infiltrative nature of this mass it was felt not to be amenable to surgical resection without incurring unacceptable morbidity and potentially mortality in this patient who ultimately has a very poor prognosis.    At the completion of the procedure there was residual tumor in the form of the 20cm pelvic mass residual as no attempt was made to resect the mass.  Procedure:   The patient was seen in the Holding Room. The risks, benefits, complications, treatment options, and expected outcomes were discussed with the patient.  The patient concurred with the proposed plan, giving informed consent.   The patient was  identified as Kristina Phelps  and the procedure verified as BSO, omentectomy, tumor debulking. A Time Out was held and the above information confirmed upon entry to the operating room..  After induction of anesthesia, the patient was draped and prepped in the usual sterile manner.  She was prepped and draped in the normal sterile fashion in the dorsal lithotomy position in padded Allen stirrups with good attention paid to support of the lower back and lower extremities. Position was adjusted for appropriate support. A Foley catheter was placed to gravity.   A midline vertical incision was made and carried through  the subcutaneous tissue to the fascia. The fascial incision was made and extended superiorally. The  rectus muscles were separated. The peritoneum was identified and entered. Peritoneal incision was extended longitudinally.  The abdominal cavity was entered sharply and without incident. A Bookwalter retractor was then placed. A survey of the abdomen and pelvis revealed the above findings, which were significant for a large (20cm) solid pelvic tumor replacing all gynecologic organs (unclear organ of origin) with an otherwise normal upper abdominal survey.  Pelvic washings were obtained.   Careful exploration of the peritoneal cavity took place. During that time there was unavoidable fragmentation of the anterior aspect of the tumor mass. This tissue was sent for frozen section which revealed a poorly differentiated malignancy. When it was determined that the mass was not resectable without incurring morbidity in this debilitated patient, attempts at resection were abandoned and a decision was made to close the abdomen.  The peritoneal cavity was irrigated and hemostasis was confirmed at all surgical sites.  Residual tumor was present at the 20cm pelvic mass which appeared to be arising from the left retroperitoneum.   The fascia was reapproximated with 0 looped PDS using a total of two sutures. The subcutaneous layer was then irrigated copiously.  Exparel long acting local anesthetic was infiltrated into the subcutaneous tissues. The skin was closed with staples. The patient tolerated the procedure well.   Sponge, lap and needle counts were correct x 2.   Donaciano Eva, MD

## 2015-01-11 LAB — URINE MICROSCOPIC-ADD ON: Bacteria, UA: NONE SEEN

## 2015-01-11 LAB — CBC
HCT: 30.6 % — ABNORMAL LOW (ref 36.0–46.0)
HEMOGLOBIN: 9.5 g/dL — AB (ref 12.0–15.0)
MCH: 24.9 pg — AB (ref 26.0–34.0)
MCHC: 31 g/dL (ref 30.0–36.0)
MCV: 80.1 fL (ref 78.0–100.0)
PLATELETS: 320 10*3/uL (ref 150–400)
RBC: 3.82 MIL/uL — AB (ref 3.87–5.11)
RDW: 17.3 % — ABNORMAL HIGH (ref 11.5–15.5)
WBC: 15.5 10*3/uL — AB (ref 4.0–10.5)

## 2015-01-11 LAB — BASIC METABOLIC PANEL
ANION GAP: 9 (ref 5–15)
BUN: 23 mg/dL — AB (ref 6–20)
CHLORIDE: 97 mmol/L — AB (ref 101–111)
CO2: 23 mmol/L (ref 22–32)
Calcium: 8.2 mg/dL — ABNORMAL LOW (ref 8.9–10.3)
Creatinine, Ser: 0.98 mg/dL (ref 0.44–1.00)
GFR calc Af Amer: >60 mL/min (ref >60)
GFR, EST NON AFRICAN AMERICAN: 54 mL/min — AB (ref >60)
GLUCOSE: 226 mg/dL — AB (ref 65–99)
POTASSIUM: 5.2 mmol/L — AB (ref 3.5–5.1)
SODIUM: 129 mmol/L — AB (ref 135–145)

## 2015-01-11 LAB — COMPREHENSIVE METABOLIC PANEL
ALK PHOS: 144 U/L — AB (ref 38–126)
ALT: 26 U/L (ref 14–54)
AST: 52 U/L — AB (ref 15–41)
Albumin: 2.6 g/dL — ABNORMAL LOW (ref 3.5–5.0)
Anion gap: 8 (ref 5–15)
BUN: 25 mg/dL — AB (ref 6–20)
CALCIUM: 8.3 mg/dL — AB (ref 8.9–10.3)
CHLORIDE: 98 mmol/L — AB (ref 101–111)
CO2: 22 mmol/L (ref 22–32)
CREATININE: 1 mg/dL (ref 0.44–1.00)
GFR calc non Af Amer: 53 mL/min — ABNORMAL LOW (ref >60)
GLUCOSE: 159 mg/dL — AB (ref 65–99)
Potassium: 5 mmol/L (ref 3.5–5.1)
SODIUM: 128 mmol/L — AB (ref 135–145)
Total Bilirubin: 0.1 mg/dL — ABNORMAL LOW (ref 0.3–1.2)
Total Protein: 6.2 g/dL — ABNORMAL LOW (ref 6.5–8.1)

## 2015-01-11 LAB — CBC WITH DIFFERENTIAL/PLATELET
BASOS PCT: 0 %
Basophils Absolute: 0 10*3/uL (ref 0.0–0.1)
EOS ABS: 0 10*3/uL (ref 0.0–0.7)
EOS PCT: 0 %
HCT: 32.9 % — ABNORMAL LOW (ref 36.0–46.0)
HEMOGLOBIN: 10.2 g/dL — AB (ref 12.0–15.0)
LYMPHS ABS: 1 10*3/uL (ref 0.7–4.0)
Lymphocytes Relative: 5 %
MCH: 25.1 pg — AB (ref 26.0–34.0)
MCHC: 31 g/dL (ref 30.0–36.0)
MCV: 80.8 fL (ref 78.0–100.0)
MONO ABS: 0.9 10*3/uL (ref 0.1–1.0)
MONOS PCT: 5 %
Neutro Abs: 16.8 10*3/uL — ABNORMAL HIGH (ref 1.7–7.7)
Neutrophils Relative %: 90 %
PLATELETS: 339 10*3/uL (ref 150–400)
RBC: 4.07 MIL/uL (ref 3.87–5.11)
RDW: 17.4 % — AB (ref 11.5–15.5)
WBC: 18.7 10*3/uL — ABNORMAL HIGH (ref 4.0–10.5)

## 2015-01-11 LAB — URINALYSIS, ROUTINE W REFLEX MICROSCOPIC
BILIRUBIN URINE: NEGATIVE
GLUCOSE, UA: NEGATIVE mg/dL
Ketones, ur: NEGATIVE mg/dL
Leukocytes, UA: NEGATIVE
Nitrite: NEGATIVE
PROTEIN: 30 mg/dL — AB
Specific Gravity, Urine: 1.025 (ref 1.005–1.030)
pH: 5.5 (ref 5.0–8.0)

## 2015-01-11 LAB — TROPONIN I

## 2015-01-11 MED ORDER — HALOPERIDOL LACTATE 5 MG/ML IJ SOLN: 0.2500 mg | Freq: Four times a day (QID) | INTRAMUSCULAR | Status: DC | PRN

## 2015-01-11 MED ORDER — CEPHALEXIN 500 MG PO CAPS
500.0000 mg | ORAL_CAPSULE | Freq: Four times a day (QID) | ORAL | Status: DC
  Administered 2015-01-11 – 2015-01-12 (×3): 500 mg via ORAL
  Filled 2015-01-11 (×8): qty 1

## 2015-01-11 MED ORDER — SODIUM CHLORIDE 0.9 % IV BOLUS (SEPSIS)
250.0000 mL | Freq: Once | INTRAVENOUS | Status: AC
Start: 2015-01-11 — End: 2015-01-11
  Administered 2015-01-11: 250 mL via INTRAVENOUS

## 2015-01-11 MED ORDER — SODIUM CHLORIDE 0.9 % IV SOLN
INTRAVENOUS | Status: DC
  Administered 2015-01-11: 09:00:00 via INTRAVENOUS

## 2015-01-11 MED ORDER — SODIUM CHLORIDE 0.9 % IV BOLUS (SEPSIS)
250.0000 mL | Freq: Once | INTRAVENOUS | Status: AC
  Administered 2015-01-11: 250 mL via INTRAVENOUS

## 2015-01-11 NOTE — Progress Notes (Signed)
1 Day Post-Op Procedure(s) (LRB): EXPLORATORY LAPAROTOMY ,BIOPSY OF PELVIC MASS (Bilateral)  Subjective: Patient reports minimal pain this am.  Tolerated breakfast and liquids with no nausea or emesis.  Asking about going home today.  Denies chest pain, dyspnea, passing flatus, or having a bowel movement.  Husband concerned about new onset of erythema with the left upper arm.  He feels it is not related to the chronic wound that is cared for at the wound center.   Objective: Vital signs in last 24 hours: Temp:  [97.6 F (36.4 C)-98.6 F (37 C)] 98.1 F (36.7 C) (11/30 1000) Pulse Rate:  [78-104] 104 (11/30 1000) Resp:  [14-25] 25 (11/30 1000) BP: (101-129)/(53-75) 114/63 mmHg (11/30 1000) SpO2:  [97 %-100 %] 97 % (11/30 1000)    Intake/Output from previous day: 11/29 0701 - 11/30 0700 In: 1700 [I.V.:1700] Out: 475 [Urine:275; Blood:100]  Physical Examination: General: alert, cooperative and no distress Resp: clear to auscultation bilaterally Cardio: regular rate and rhythm, S1, S2 normal, no murmur, click, rub or gallop GI: soft, non-tender; bowel sounds normal; no masses,  no organomegaly and incision: midline incision with honeycomb dressing without active bleeding or drainage Extremities: BLE without edema, left upper arm erythematous and increased edema from previous assessment pre-op (hx lymphedema)  Wound to the left upper arm near the axilla with intact dressing.  Minimal amount of brownish drainage on the dressing.  Wound assessed by Dr. Delsa Sale  Labs: WBC/Hgb/Hct/Plts:  15.5/9.5/30.6/320 (11/30 [id number]) BUN/Cr/glu/ALT/AST/amyl/lip:  23/0.98/--/--/--/--/-- (11/30 [id number])  Assessment: 79 y.o. s/p Procedure(s): EXPLORATORY LAPAROTOMY ,BIOPSY OF PELVIC MASS: stable Pain:  Pain is well-controlled on PRN medications.  Heme: Hgb 9.5 and Hct 30.6 this am.  Stable post-operatively.  ID: Left upper extremity cellulitis post-op.  Chronic wound to left upper arm near the axilla  assessed by Dr. Delsa Sale.    CV: BP and HR stable post-operatively.  Continue to monitor.  Plan to resume Xarelto in 48 hours.    GI:  Tolerating po: Yes.  No emesis.       GU: 250 cc NS bolus given previously (early am on 01/11/15) and again this am.  Monitor output.  Output adequate per Dr. Delsa Sale    FEN: Hyponatremia and hyperkalemia.  Recheck BMET this afternoon.  Change IVF to NS at 50.  Prophylaxis: pharmacologic prophylaxis (with any of the following: enoxaparin (Lovenox) 40mg  SQ 2 hours prior to surgery then every day) and intermittent pneumatic compression boots.  Resume Xarelto in 48 hours post-op  Plan: Repeat labs at 12:00 IVF to NS at 50 PT evaluation  Begin Keflex 500 mg Q6H for LUE cellulitis Social work consult for placement.  Family requesting [facility] in [place] Discontinue foley Monitor intake and output Wound consult to evaluate LUE wound and new onset erythema per Dr. Delsa Sale    LOS: 1 day    CROSS, MELISSA DEAL 01/11/2015, 10:32 AM

## 2015-01-11 NOTE — Care Management Note (Signed)
Case Management Note  Patient Details  Name: Kristina Phelps MRN: [id number] Date of Birth: [birth year]  Subjective/Objective:        S/p Exploratory laparotomy with biopsy of pelvic mass            Action/Plan: Discharge planning per CSW  Expected Discharge Date:                  Expected Discharge Plan:  Skilled Nursing Facility  In-House Referral:  Clinical Social Work  Discharge planning Services  CM Consult  Post Acute Care Choice:  NA Choice offered to:  NA  DME Arranged:  N/A DME Agency:  NA  HH Arranged:  NA HH Agency:  NA  Status of Service:  Completed, signed off  Medicare Important Message Given:    Date Medicare IM Given:    Medicare IM give by:    Date Additional Medicare IM Given:    Additional Medicare Important Message give by:     If discussed at [place] of Stay Meetings, dates discussed:    Additional Comments:  Guadalupe Maple, RN 01/11/2015, 8:17 AM

## 2015-01-11 NOTE — Consult Note (Signed)
WOC wound consult note Reason for Consult: Patient seen for erythema of left arm as well as small open area  at proximal end of previously healed incision leaking lymphatic fluid. Patient seen at the outpatient wound care center at [facility] for this wound and a calcium alginate dressing has been used in the past. Wound type:full thickness Pressure Ulcer POA: No Measurement:0.4cm round with depth not known (full thickness) Wound [id number], moist Drainage (amount, consistency, odor) moderate amount of serous exudate Periwound:slightly macerated from dressing requiring change Dressing procedure/placement/frequency: I believe this is a non-healing, chronic wound and will manage the exudate with a silver hydrofiber and foam dressing changed daily to absorb and contain drainage, provide local antimicrobial coverage and manage odor. Hopefully the erythema at the distal arm will resolve as I see no indication for that at this time (no sticks, no BPs, etc.). Additionally, I have today provided guidance for nursing for placement of a prophylactic sacral dressing as she is not very mobile and bilateral pressure redistribution boots for the heels to prevent ulceration cause by decreased mobility.  [facility] nursing team will not follow, but will remain available to this patient, the nursing and medical teams.  Please re-consult if needed. Thanks, Maudie Flakes, MSN, RN, [place], Arther Abbott  Pager# [phone]

## 2015-01-11 NOTE — Progress Notes (Signed)
Patients  output via Foley -75 cc after  iv bolus and IVF increase to 100/hr- L [facility],MD notified.

## 2015-01-11 NOTE — Evaluation (Signed)
Physical Therapy Evaluation Patient Details Name: Kristina Phelps MRN: [id number] DOB: 1935/09/06 Today's Date: 01/11/2015   History of Present Illness  Exploratory laparotomy with biopsy of pelvic mass  Clinical Impression  Patient is quite confused, did ambulate  With RW. Patient will benefit from  Acute PT to address problems listed in the note below.    Follow Up Recommendations SNF;Supervision/Assistance - 24 hour    Equipment Recommendations  None recommended by PT    Recommendations for Other Services       Precautions / Restrictions Restrictions Other Position/Activity Restrictions: Kristina Phelps is a 79 y.o. female with history of hypertension, prior TIA, seizures, remote breast cancer, right lower extremity DVT diagnosed in September 2016 treated with Xarelto and now with large abdominopelvic mass. S/P   expl lap 11/29 and biopsy, h/o IVC filter.      Mobility  Bed Mobility Overal bed mobility: Needs Assistance Bed Mobility: Supine to Sit;Sit to Supine     Supine to sit: Mod assist Sit to supine: Mod assist   General bed mobility comments: assist with LUE and trunk, legs back to bed  Transfers Overall transfer level: Needs assistance Equipment used: Rolling walker (2 wheeled) Transfers: Sit to/from [facility] Sit to Stand: Mod assist;+2 safety/equipment Stand pivot transfers: Mod assist;+2 safety/equipment       General transfer comment: multimodal cues  for activity, does not follow  commands,  frequent cues  for standing , pivot from bed to [facility], ambulating with RW.  Ambulation/Gait Ambulation/Gait assistance: Min assist;+2 safety/equipment Ambulation Distance (Feet): 100 Feet Assistive device: Rolling walker (2 wheeled) Gait Pattern/deviations: Step-through pattern;Staggering right;Staggering left     General Gait Details: multimodal cues for safety, does not really follow,  able to ambulate with Rw.  Stairs             Wheelchair Mobility    Modified Rankin (Stroke Patients Only)       Balance Overall balance assessment: Needs assistance Sitting-balance support: Bilateral upper extremity supported;Feet supported Sitting balance-Leahy Scale: Fair     Standing balance support: During functional activity;Bilateral upper extremity supported Standing balance-Leahy Scale: Poor                               Pertinent Vitals/Pain Pain Assessment: Faces Faces Pain Scale: Hurts little more Pain Location: abdomen Pain Intervention(s): Limited activity within patient's tolerance    Home Living Family/patient expects to be discharged to:: Skilled nursing facility Living Arrangements: Spouse/significant other                    Prior Function Level of Independence: Independent with assistive device(s)               Hand Dominance        Extremity/Trunk Assessment   Upper Extremity Assessment: LUE deficits/detail       LUE Deficits / Details: significant edema L UE, arm is flaccid , in upper  arm, able to grip RW and push it. oozing  from incision  in upper arm.   Lower Extremity Assessment: Generalized weakness      Cervical / Trunk Assessment: Kyphotic  Communication   Communication: Receptive difficulties;Expressive difficulties  Cognition Arousal/Alertness: Awake/alert Behavior During Therapy: Restless Overall Cognitive Status: Impaired/Different from baseline Area of Impairment: Orientation;Attention;Following commands;Safety/judgement;Awareness;Problem solving Orientation Level: Place;Time;Situation Current Attention Level: Focused Memory: Decreased recall of precautions;Decreased short-term memory Following Commands: Follows one step commands inconsistently  Safety/Judgement: Decreased awareness of safety   Problem Solving: Slow processing;Difficulty sequencing;Requires verbal cues;Requires tactile cues      General Comments      Exercises         Assessment/Plan    PT Assessment Patient needs continued PT services  PT Diagnosis Difficulty walking;Generalized weakness;Altered mental status   PT Problem List Decreased strength;Decreased activity tolerance;Decreased balance;Decreased mobility;Decreased cognition;Decreased knowledge of precautions;Decreased safety awareness;Decreased knowledge of use of DME  PT Treatment Interventions DME instruction;Gait training;Functional mobility training;Therapeutic activities;Therapeutic exercise;Patient/family education   PT Goals (Current goals can be found in the Care Plan section) Acute Rehab PT Goals Patient Stated Goal: patient unable PT Goal Formulation: With family Time For Goal Achievement: 01/25/15 Potential to Achieve Goals: Fair    Frequency Min 3X/week   Barriers to discharge Decreased caregiver support      Co-evaluation               End of Session Equipment Utilized During Treatment: Gait belt Activity Tolerance: Patient tolerated treatment well Patient left: in bed;with call bell/phone within reach;with bed alarm set;with family/visitor present Nurse Communication: Mobility status         Time: [id number] PT Time Calculation (min) (ACUTE ONLY): 31 min   Charges:   PT Evaluation $Initial PT Evaluation Tier I: 1 Procedure PT Treatments $Gait Training: 8-22 mins   PT G CodesClaretha Cooper 01/11/2015, 5:26 PM .sign

## 2015-01-11 NOTE — Clinical Social Work Placement (Signed)
   CLINICAL SOCIAL WORK PLACEMENT  NOTE  Date:  01/11/2015  Patient Details  Name: Kristina Phelps MRN: [id number] Date of Birth: 1935/10/21  Clinical Social Work is seeking post-discharge placement for this patient at the [place] level of care (*CSW will initial, date and re-position this form in  chart as items are completed):  Yes   Patient/family provided with [place] Work Department's list of facilities offering this level of care within the geographic area requested by the patient (or if unable, by the patient's family).  Yes   Patient/family informed of their freedom to choose among providers that offer the needed level of care, that participate in Medicare, Medicaid or managed care program needed by the patient, have an available bed and are willing to accept the patient.  Yes   Patient/family informed of [place]'s ownership interest in [facility] and [facility], as well as of the fact that they are under no obligation to receive care at these facilities.  PASRR submitted to EDS on 01/11/15     PASRR number received on 01/11/15     Existing PASRR number confirmed on       FL2 transmitted to all facilities in geographic area requested by pt/family on 01/11/15     FL2 transmitted to all facilities within larger geographic area on       Patient informed that his/her managed care company has contracts with or will negotiate with certain facilities, including the following:            Patient/family informed of bed offers received.  Patient chooses bed at       Physician recommends and patient chooses bed at      Patient to be transferred to   on  .  Patient to be transferred to facility by       Patient family notified on   of transfer.  Name of family member notified:        PHYSICIAN Please sign FL2     Additional Comment:    _______________________________________________ Ludwig Clarks,  LCSW 01/11/2015, 1:09 PM

## 2015-01-11 NOTE — Progress Notes (Signed)
Melissa cross NP notified patient agitated and refusing medication. She will review home meds and and adjust as needed

## 2015-01-11 NOTE — Progress Notes (Signed)
Poor urine output  - L Jackson-Moore,MD notified and orders received.

## 2015-01-11 NOTE — Clinical Social Work Note (Signed)
Clinical Social Work Assessment  Patient Details  Name: Kristina Phelps MRN: [id number] Date of Birth: Jun 04, 1935  Date of referral:  01/11/15               Reason for consult:  Facility Placement                Permission sought to share information with:  Family Supports Permission granted to share information::  Yes, Verbal Permission Granted  Name::     husband and niece  Agency::     Relationship::     Contact Information:     Housing/Transportation Living arrangements for the past 2 months:  Single Family Home Source of Information:  Spouse Patient Interpreter Needed:  None Criminal Activity/Legal Involvement Pertinent to Current Situation/Hospitalization:  No - Comment as needed Significant Relationships:  Friend, Other Family Members, Spouse Lives with:  Spouse Do you feel safe going back to the place where you live?  No Need for family participation in patient care:  No (Coment)  Care giving concerns:  MD consulted CSW for ?SNF   Social Worker assessment / plan:  FL2 and PASARR to be completed for ?SNF  Employment status:  Retired Forensic scientist:  Medicare PT Recommendations:  [place] / Referral to community resources:  [place]  Patient/Family's Response to care:  CSW met with patient and her husband of 42 years and their niece- they are agreeable to SNF at dc. Husband requesting [facility] in  if available.  Patient/Family's Understanding of and Emotional Response to Diagnosis, Current Treatment, and Prognosis:  Family implied to CSW that her prognosis is poor- they did not elaborate but indicated plans for short stay.  Emotional Assessment Appearance:  Other (Comment Required Attitude/Demeanor/Rapport:  Unable to Assess Affect (typically observed):  Withdrawn Orientation:  Oriented to Self Alcohol / Substance use:    Psych involvement (Current and /or in the community):  No (Comment)  Discharge Needs   Concerns to be addressed:    Readmission within the last 30 days:  No Current discharge risk:  None Barriers to Discharge:  No Barriers Identified   Ludwig Clarks, LCSW 01/11/2015, 1:06 PM

## 2015-01-11 NOTE — Progress Notes (Signed)
Received phone call from [facility] RN about patient being agitated and refusing to take her medication.  Stating the husband states this happens at home and he gives her time to calm down.  No medication for agitation used at home.  Situation discussed with Dr. Delsa Sale.  Dr. Othelia Pulling ordering labs from the delirium order set (CBC, CMET, Troponin) and haldol ordered PRN.  Dr. Othelia Pulling would like to see how the patient is doing in an hour or two before switching antibiotics to IV.

## 2015-01-11 NOTE — NC FL2 (Signed)
[place] LEVEL OF CARE SCREENING TOOL     IDENTIFICATION  Patient Name: Kristina Phelps Birthdate: 08-May-1935 Sex: female Admission Date (Current Location): 01/10/2015  [facility] and [place] Number: [place] and Address:  [facility],  [place] [address], [place]      Provider Number: [id number]  Attending Physician Name and Address:  Everitt Amber, MD  Relative Name and Phone Number:       Current Level of Care: Hospital Recommended Level of Care: [place] Prior Approval Number:    Date Approved/Denied:   PASRR Number:    Discharge Plan: SNF    Current Diagnoses: Patient Active Problem List   Diagnosis Date Noted  . Pelvic mass in female 01/10/2015  . Delirium due to general medical condition 01/10/2015  . Protein-calorie malnutrition, severe ([place]) 01/10/2015  . Anemia 01/10/2015  . 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 Epilepsy ([place]) 12/19/2014  . DVT of lower extremity (deep venous thrombosis) ([place]) 12/19/2014  . Lymphedema of arm 12/19/2014  . Fracture of humerus 06/22/2013    Orientation ACTIVITIES/SOCIAL BLADDER RESPIRATION    Self, Time  Active Continent O2 (As needed) (2L)  BEHAVIORAL SYMPTOMS/MOOD NEUROLOGICAL BOWEL NUTRITION STATUS      Continent Diet (SOFT)  PHYSICIAN VISITS COMMUNICATION OF NEEDS Height & Weight Skin    Verbally 5\' 4"  (162.6 cm) 150 lbs. Surgical wounds          AMBULATORY STATUS RESPIRATION    Assist extensive O2 (As needed) (2L)      Personal Care Assistance Level of Assistance  Bathing, Dressing Bathing Assistance: Limited assistance   Dressing Assistance: Limited assistance      Functional Limitations [place]  PT (By licensed PT), OT (By licensed OT)     PT Frequency: 5 OT Frequency: 5           Additional Factors Info  Code Status, Allergies Code Status Info:  Full Allergies Info: Depakote Nexium Valium           Current Medications (01/11/2015):  This is the current hospital active medication list Current Facility-Administered Medications  Medication Dose Route Frequency Provider Last Rate Last Dose  . 0.9 %  sodium chloride infusion   Intravenous Continuous Dorothyann Gibbs, NP 50 mL/hr at 01/11/15 0850    . acetaminophen (TYLENOL) tablet 1,000 mg  1,000 mg Oral BID Lahoma Crocker, MD   1,000 mg at 01/10/15 2138  . cephALEXin (KEFLEX) capsule 500 mg  500 mg Oral 4 times per day Dorothyann Gibbs, NP      . enoxaparin (LOVENOX) injection 40 mg  40 mg Subcutaneous Q24H Lahoma Crocker, MD   40 mg at 01/11/15 0842  . feeding supplement (ENSURE ENLIVE) (ENSURE ENLIVE) liquid 237 mL  237 mL Oral BID BM Lahoma Crocker, MD   237 mL at 01/10/15 1544  . lacosamide (VIMPAT) tablet 200 mg  200 mg Oral Q12H Everitt Amber, MD   200 mg at 01/11/15 0842  . losartan (COZAAR) tablet 100 mg  100 mg Oral Daily Lahoma Crocker, MD      . nebivolol (BYSTOLIC) tablet 10 mg  10 mg Oral Daily Lahoma Crocker, MD      . ondansetron [facility]) tablet 4 mg  4 mg Oral Q6H PRN Lahoma Crocker, MD       Or  .  ondansetron (ZOFRAN) injection 4 mg  4 mg Intravenous Q6H PRN Lahoma Crocker, MD      . oxyCODONE (Oxy IR/ROXICODONE) immediate release tablet 5 mg  5 mg Oral Q4H PRN Lahoma Crocker, MD   5 mg at 01/11/15 0236  . simvastatin (ZOCOR) tablet 20 mg  20 mg Oral QHS Lahoma Crocker, MD   20 mg at 01/10/15 2138  . sodium chloride 0.9 % bolus 250 mL  250 mL Intravenous Once Dorothyann Gibbs, NP      . traMADol Veatrice [place]bon) tablet 100 mg  100 mg Oral Q12H Lahoma Crocker, MD   100 mg at 01/10/15 2137     Discharge Medications: Please see discharge summary for a list of discharge medications.  Relevant Imaging Results:  Relevant Lab Results:  Recent Labs    Additional Information SSN [id number]  Ludwig Clarks, LCSW

## 2015-01-12 LAB — CBC WITH DIFFERENTIAL/PLATELET
BASOS ABS: 0 10*3/uL (ref 0.0–0.1)
BASOS PCT: 0 %
Eosinophils Absolute: 0 10*3/uL (ref 0.0–0.7)
Eosinophils Relative: 0 %
HEMATOCRIT: 30.7 % — AB (ref 36.0–46.0)
HEMOGLOBIN: 9.6 g/dL — AB (ref 12.0–15.0)
LYMPHS PCT: 8 %
Lymphs Abs: 1.3 10*3/uL (ref 0.7–4.0)
MCH: 25.1 pg — ABNORMAL LOW (ref 26.0–34.0)
MCHC: 31.3 g/dL (ref 30.0–36.0)
MCV: 80.2 fL (ref 78.0–100.0)
MONO ABS: 1.3 10*3/uL — AB (ref 0.1–1.0)
MONOS PCT: 8 %
NEUTROS ABS: 13.7 10*3/uL — AB (ref 1.7–7.7)
NEUTROS PCT: 84 %
Platelets: 309 10*3/uL (ref 150–400)
RBC: 3.83 MIL/uL — ABNORMAL LOW (ref 3.87–5.11)
RDW: 17.3 % — ABNORMAL HIGH (ref 11.5–15.5)
WBC: 16.3 10*3/uL — ABNORMAL HIGH (ref 4.0–10.5)

## 2015-01-12 LAB — BASIC METABOLIC PANEL
ANION GAP: 7 (ref 5–15)
BUN: 27 mg/dL — ABNORMAL HIGH (ref 6–20)
CHLORIDE: 99 mmol/L — AB (ref 101–111)
CO2: 25 mmol/L (ref 22–32)
Calcium: 8.8 mg/dL — ABNORMAL LOW (ref 8.9–10.3)
Creatinine, Ser: 1.03 mg/dL — ABNORMAL HIGH (ref 0.44–1.00)
GFR calc non Af Amer: 51 mL/min — ABNORMAL LOW (ref >60)
GFR, EST AFRICAN AMERICAN: 59 mL/min — AB (ref >60)
GLUCOSE: 123 mg/dL — AB (ref 65–99)
Potassium: 5.3 mmol/L — ABNORMAL HIGH (ref 3.5–5.1)
Sodium: 131 mmol/L — ABNORMAL LOW (ref 135–145)

## 2015-01-12 MED ORDER — ACETAMINOPHEN 10 MG/ML IV SOLN
1000.0000 mg | Freq: Four times a day (QID) | INTRAVENOUS | Status: AC | PRN
  Filled 2015-01-12: qty 100

## 2015-01-12 MED ORDER — LACOSAMIDE 50 MG PO TABS: 200.0000 mg | ORAL_TABLET | Freq: Two times a day (BID) | ORAL | Status: DC

## 2015-01-12 MED ORDER — LACOSAMIDE 50 MG PO TABS
200.0000 mg | ORAL_TABLET | Freq: Two times a day (BID) | ORAL | Status: DC
  Filled 2015-01-12: qty 4

## 2015-01-12 MED ORDER — HYDROMORPHONE HCL 1 MG/ML IJ SOLN
0.2500 mg | INTRAMUSCULAR | Status: DC | PRN
  Administered 2015-01-13: 0.5 mg via INTRAVENOUS
  Administered 2015-01-14: 0.25 mg via INTRAVENOUS
  Administered 2015-01-14 – 2015-01-15 (×3): 0.5 mg via INTRAVENOUS
  Administered 2015-01-15 (×3): 0.25 mg via INTRAVENOUS
  Administered 2015-01-16: 0.5 mg via INTRAVENOUS
  Filled 2015-01-12 (×10): qty 1

## 2015-01-12 MED ORDER — DEXTROSE-NACL 5-0.9 % IV SOLN
INTRAVENOUS | Status: DC
  Administered 2015-01-12 – 2015-01-16 (×6): via INTRAVENOUS

## 2015-01-12 MED ORDER — SODIUM CHLORIDE 0.9 % IV SOLN
200.0000 mg | Freq: Two times a day (BID) | INTRAVENOUS | Status: DC
  Administered 2015-01-12 – 2015-01-16 (×8): 200 mg via INTRAVENOUS
  Filled 2015-01-12 (×9): qty 20

## 2015-01-12 MED ORDER — BISACODYL 10 MG RE SUPP
10.0000 mg | Freq: Every day | RECTAL | Status: DC
  Administered 2015-01-12 – 2015-01-15 (×4): 10 mg via RECTAL
  Filled 2015-01-12 (×5): qty 1

## 2015-01-12 MED ORDER — SODIUM CHLORIDE 0.9 % IV SOLN
200.0000 mg | Freq: Once | INTRAVENOUS | Status: AC
  Administered 2015-01-12: 200 mg via INTRAVENOUS
  Filled 2015-01-12: qty 20

## 2015-01-12 MED ORDER — CEFAZOLIN SODIUM 1-5 GM-% IV SOLN
1.0000 g | Freq: Three times a day (TID) | INTRAVENOUS | Status: DC
Start: 2015-01-12 — End: 2015-01-16
  Administered 2015-01-12 – 2015-01-16 (×13): 1 g via INTRAVENOUS
  Filled 2015-01-12 (×14): qty 50

## 2015-01-12 MED ORDER — METOPROLOL TARTRATE 1 MG/ML IV SOLN
2.5000 mg | Freq: Four times a day (QID) | INTRAVENOUS | Status: DC
  Administered 2015-01-12 – 2015-01-16 (×17): 2.5 mg via INTRAVENOUS
  Filled 2015-01-12 (×20): qty 5

## 2015-01-12 NOTE — Progress Notes (Signed)
01/12/15 1645  Patient husband does not want the foley removed today.

## 2015-01-12 NOTE — Progress Notes (Signed)
CSW spoke with patient and family and they have selected bed at [facility] in [place]. CSW will follow up tomorrow to further assist with dc plans.   Eduard Clos, MSW, [place]

## 2015-01-12 NOTE — Progress Notes (Signed)
2 Days Post-Op Procedure(s) (LRB): EXPLORATORY LAPAROTOMY ,BIOPSY OF PELVIC MASS (Bilateral)  Subjective: Patient reports feeling better after having an episode of emesis this am.  Episode also reported last pm.  Minimal pain this am.  Tolerated breakfast then emesis reported around 10 am today.  Up to chair yesterday with PT.  Reporting the erythema in the LUE as "about the same."  Friends and family at the bedside.  Denies chest pain, dyspnea, passing flatus, or having a bowel movement.    Objective: Vital signs in last 24 hours: Temp:  [98 F (36.7 C)-99.3 F (37.4 C)] 98.4 F (36.9 C) (12/01 1053) Pulse Rate:  [87-103] 92 (12/01 1053) Resp:  [18-20] 20 (12/01 1053) BP: (112-146)/(53-90) 120/64 mmHg (12/01 1053) SpO2:  [91 %-95 %] 95 % (12/01 1053)    Intake/Output from previous day: 11/30 0701 - 12/01 0700 In: 120 [P.O.:120] Out: 355 [Urine:355]  Physical Examination: General: alert, cooperative, no distress and pleasantly confused intermittently, looks to husband when answering questions at times Resp: clear to auscultation bilaterally Cardio: regular rate and rhythm, S1, S2 normal, no murmur, click, rub or gallop GI: incision: midline incision with honeycomb dressing without active bleeding or drainage and abdomen distended, hypoactive bowel sounds, tympanic on percussion, non-tender, mild ecchymosis noted around the incision on the abdomen Extremities: BLE without edema, left upper arm erythematous ( no change from 01/11/15 assessment)  Wound to the left upper arm near the axilla with intact dressing.    Labs: WBC/Hgb/Hct/Plts:  16.3/9.6/30.7/309 (12/01 [id number]) BUN/Cr/glu/ALT/AST/amyl/lip:  27/1.03/--/--/--/--/-- (12/01 0420)  Assessment: 79 y.o. s/p Procedure(s): EXPLORATORY LAPAROTOMY ,BIOPSY OF PELVIC MASS: stable Pain:  Pain is well-controlled on PRN medications.  Heme: Hgb 9.6 and Hct 30.7 this am.  Stable post-operatively.  ID: Left upper extremity cellulitis  post-op.  Chronic wound to left upper arm near the axilla without signs of infection.    CV: BP and HR stable post-operatively.  Continue to monitor.      GI:  Tolerating po: No.  Emesis this am.  Zofran ordered PRN.       GU: Discontinue foley.  Replaced 11/30 due to urinary retention.  Monitor output.  Output adequate per Dr. Delsa Sale    FEN: Hyponatremia improving and hyperkalemia 5.3 this am.  Recheck CMET in the am.  Prophylaxis: pharmacologic prophylaxis (with any of the following: enoxaparin (Lovenox) 40mg  SQ 2 hours prior to surgery then every day) and intermittent pneumatic compression boots.    Plan: NPO except sips of clears until post-op ileus resolves Meds to IV (Bystolic and cozaar changed to metoprolol IV per pharmacy recommendation, Vimpat to IV) Ancef 1 G Q8H for LUE cellulitis IV Tylenol and  Hydromorphone IV PRN Dulcolax suppository daily until flatus or BM Repeat labs in the am IVF D5NS at 75 cc/hr Discontinue foley Monitor intake and output Appreciate wound consult and PT consult Social work consult for placement.  Family requesting [facility] in [place] Continue post-operative plan of care per Dr. Delsa Sale   LOS: 2 days    CROSS, MELISSA DEAL 01/12/2015, 1:08 PM

## 2015-01-12 NOTE — Progress Notes (Signed)
Received phone call from [facility]., RN about bladder scan results of 325 with a 50 cc void before the scan.  Dr. Delsa Sale notified.  Orders taken to insert foley catheter due to urinary retention.  IV to saline lock.

## 2015-01-13 LAB — CBC WITH DIFFERENTIAL/PLATELET
BASOS ABS: 0 10*3/uL (ref 0.0–0.1)
Basophils Relative: 0 %
EOS PCT: 0 %
Eosinophils Absolute: 0 10*3/uL (ref 0.0–0.7)
HEMATOCRIT: 28.5 % — AB (ref 36.0–46.0)
Hemoglobin: 9 g/dL — ABNORMAL LOW (ref 12.0–15.0)
LYMPHS ABS: 0.8 10*3/uL (ref 0.7–4.0)
LYMPHS PCT: 6 %
MCH: 25.3 pg — AB (ref 26.0–34.0)
MCHC: 31.6 g/dL (ref 30.0–36.0)
MCV: 80.1 fL (ref 78.0–100.0)
Monocytes Absolute: 1.1 10*3/uL — ABNORMAL HIGH (ref 0.1–1.0)
Monocytes Relative: 8 %
NEUTROS ABS: 11.8 10*3/uL — AB (ref 1.7–7.7)
Neutrophils Relative %: 86 %
Platelets: 295 10*3/uL (ref 150–400)
RBC: 3.56 MIL/uL — AB (ref 3.87–5.11)
RDW: 17.6 % — ABNORMAL HIGH (ref 11.5–15.5)
WBC: 13.7 10*3/uL — AB (ref 4.0–10.5)

## 2015-01-13 LAB — COMPREHENSIVE METABOLIC PANEL
ALK PHOS: 126 U/L (ref 38–126)
ALT: 20 U/L (ref 14–54)
ANION GAP: 5 (ref 5–15)
AST: 33 U/L (ref 15–41)
Albumin: 2.2 g/dL — ABNORMAL LOW (ref 3.5–5.0)
BUN: 29 mg/dL — ABNORMAL HIGH (ref 6–20)
CALCIUM: 8.2 mg/dL — AB (ref 8.9–10.3)
CO2: 26 mmol/L (ref 22–32)
Chloride: 100 mmol/L — ABNORMAL LOW (ref 101–111)
Creatinine, Ser: 0.85 mg/dL (ref 0.44–1.00)
Glucose, Bld: 160 mg/dL — ABNORMAL HIGH (ref 65–99)
Potassium: 4.5 mmol/L (ref 3.5–5.1)
SODIUM: 131 mmol/L — AB (ref 135–145)
TOTAL PROTEIN: 5.4 g/dL — AB (ref 6.5–8.1)
Total Bilirubin: 0.4 mg/dL (ref 0.3–1.2)

## 2015-01-13 NOTE — Progress Notes (Signed)
Physical Therapy Treatment Patient Details Name: SALISHA BLOXHAM MRN: [id number] DOB: 1935/02/15 Today's Date: 01-17-2015    History of Present Illness Exploratory laparotomy with biopsy of pelvic mass    PT Comments    Pt  Progressing; pt had removed her O2, sats >92% with activity; still mildly confused but better able to follow commands; nauseous after drinking water, RN aware  Follow Up Recommendations  SNF;Supervision/Assistance - 24 hour     Equipment Recommendations  None recommended by PT    Recommendations for Other Services       Precautions / Restrictions Precautions Precautions: Fall    Mobility  Bed Mobility Overal bed mobility: Needs Assistance Bed Mobility: Supine to Sit     Supine to sit: Min assist     General bed mobility comments: assist with LEs and trunk, incr time  Transfers Overall transfer level: Needs assistance Equipment used: Rolling walker (2 wheeled) Transfers: Sit to/from Stand Sit to Stand: Mod assist;+2 safety/equipment         General transfer comment: multimodal cues for hand placement, safety, wt shift  Ambulation/Gait Ambulation/Gait assistance: Min assist;+2 safety/equipment Ambulation Distance (Feet): 130 Feet Assistive device: Rolling walker (2 wheeled) Gait Pattern/deviations: Step-to pattern;Step-through pattern;Trunk flexed;Drifts right/left     General Gait Details: multimodal cues  for activity, does not follow  commands,  frequent cues  for standing , pivot, ambulating with RW.   Stairs            Wheelchair Mobility    Modified Rankin (Stroke Patients Only)       Balance   Sitting-balance support: Feet supported;No upper extremity supported Sitting balance-Leahy Scale: Fair     Standing balance support: During functional activity;Bilateral upper extremity supported Standing balance-Leahy Scale: Poor (posterior LOB)                      Cognition Arousal/Alertness:  Awake/alert Behavior During Therapy: WFL for tasks assessed/performed Overall Cognitive Status: Impaired/Different from baseline Area of Impairment: Orientation;Attention;Following commands;Safety/judgement;Awareness;Problem solving Orientation Level: Place;Time;Situation Current Attention Level: Sustained Memory: Decreased recall of precautions;Decreased short-term memory Following Commands: Follows one step commands inconsistently Safety/Judgement: Decreased awareness of safety   Problem Solving: Slow processing;Difficulty sequencing;Requires verbal cues;Requires tactile cues      Exercises      General Comments        Pertinent Vitals/Pain Pain Assessment: No/denies pain    Home Living                      Prior Function            PT Goals (current goals can now be found in the care plan section) Acute Rehab PT Goals Patient Stated Goal: to go home PT Goal Formulation: With family Time For Goal Achievement: 01/25/15 Progress towards PT goals: Progressing toward goals    Frequency  Min 3X/week    PT Plan Current plan remains appropriate    Co-evaluation             End of Session Equipment Utilized During Treatment: Gait belt Activity Tolerance: Patient tolerated treatment well Patient left: in chair;with call bell/phone within reach;with chair alarm set;with family/visitor present     Time: [id number] PT Time Calculation (min) (ACUTE ONLY): 15 min  Charges:  $Gait Training: 8-22 mins                    G Codes:      Caliya Narine 01/17/15, 11:47 AM

## 2015-01-13 NOTE — Progress Notes (Signed)
3 Days Post-Op Procedure(s) (LRB): EXPLORATORY LAPAROTOMY ,BIOPSY OF PELVIC MASS (Bilateral)  Subjective: Patient stated "I'm doing" when asked how she was this am.  Husband stating she gagging intermittently.  Mild nausea this am.  Reporting the erythema in the LUE as "slightly improved" per husband.  Denies chest pain, dyspnea, passing flatus, or having a bowel movement.    Objective: Vital signs in last 24 hours: Temp:  [97.6 F (36.4 C)-98.2 F (36.8 C)] 97.6 F (36.4 C) (12/02 1000) Pulse Rate:  [87-101] 101 (12/02 1000) Resp:  [15-20] 16 (12/02 1000) BP: (128-143)/(67-87) 132/82 mmHg (12/02 1000) SpO2:  [91 %-98 %] 95 % (12/02 1000)    Intake/Output from previous day: 12/01 0701 - 12/02 0700 In: 1356.3 [I.V.:1281.3; IV Piggyback:75] Out: [id number] [Urine:1125]  Physical Examination: General: alert, cooperative, no distress and pleasantly confused intermittently, looks to husband when answering questions at times Resp: diminished in the bases, no crackles noted Cardio: regular rate and rhythm, S1, S2 normal, no murmur, click, rub or gallop GI: incision: midline incision with honeycomb dressing without active bleeding or drainage and abdomen distended, hypoactive bowel sounds, tympanic on percussion, non-tender, mild ecchymosis noted around the incision on the abdomen Extremities: BLE without edema, left upper arm erythematous (slight decrease from 01/12/15 assessment), slightly increased warmth with the LUE  Wound to the left upper arm near the axilla with intact dressing.    Labs: WBC/Hgb/Hct/Plts:  13.7/9.0/28.5/295 (12/02 0505) BUN/Cr/glu/ALT/AST/amyl/lip:  29/0.85/--/20/33/--/-- (12/02 0505)  Assessment: 79 y.o. s/p Procedure(s): EXPLORATORY LAPAROTOMY ,BIOPSY OF PELVIC MASS: stable Pain:  Pain is well-controlled on PRN medications.  Heme: Hgb 9.0 and Hct 28.5 this am.  Stable post-operatively in relation to blood loss from surgery. Anemia of chronic disease present  pre-operatively.  ID: Left upper extremity cellulitis post-op.  Ancef ordered.  Chronic wound to left upper arm near the axilla without signs of infection.    CV: BP and HR stable post-operatively.  Continue to monitor. Metoprolol ordered IV since pt NPO.      GI:  Tolerating po: No.  Emesis x 3 reported from 12/1 to 12/2.  Zofran ordered PRN.       GU: Incontinent of urine.  Monitor output.  Output adequate per Dr. Delsa Sale    FEN: Hyponatremia improving and hyperkalemia improved this am.  Recheck BMET in the am.  Prophylaxis: pharmacologic prophylaxis (with any of the following: enoxaparin (Lovenox) 40mg  SQ 2 hours prior to surgery then every day) and intermittent pneumatic compression boots.    Plan: Continue NPO except sips of clears until post-op ileus resolves Continue Ancef 1 G Q8H for LUE cellulitis IV Tylenol and  Hydromorphone IV PRN Dulcolax suppository daily until flatus or BM Repeat labs in the am Continue IVF D5NS at 75 cc/hr Monitor intake and output Continue to Lovenox until tolerating PO to begin Xarelto Appreciate wound consult and PT consult Social work consult for placement.  Family requesting [facility] in [place] Continue post-operative plan of care per Dr. Delsa Sale   LOS: 3 days    CROSS, MELISSA DEAL 01/13/2015, 11:13 AM

## 2015-01-13 NOTE — Clinical Documentation Improvement (Signed)
OB/GYN Oncology  Can the diagnosis of anemia be further specified? Noted in H&P by Dr. Denman George. Please update your documentation within the medical record to reflect your response to this query. Do not document in BPA drop down box; findings belong in progress note. Thank you!   Iron deficiency Anemia  Nutritional anemia, including the nutrition or mineral deficits  Acute Blood Loss Anemia  Chronic Blood Loss Anemia, including the suspected or known cause  Anemia of chronic disease, including the associated chronic disease state  Other  Clinically Undetermined  Supporting Information:  H&H on admission was 10.2/33;  today's H&H  is  9/28  Please exercise your independent, professional judgment when responding. A specific answer is not anticipated or expected.  Thank You,  Zoila Shutter RN, [place] [phone]: Cell: [phone]

## 2015-01-13 NOTE — Progress Notes (Signed)
Family has selected SNF bed at [facility] in [place]- not stable for dc today- CSW will continue to follow to assist with dc planning.  Husband at bedside today- will update weekend CSW in case he is ready over weekend (doubtful). Eduard Clos, MSW, [place]

## 2015-01-14 LAB — BASIC METABOLIC PANEL
Anion gap: 10 (ref 5–15)
BUN: 29 mg/dL — ABNORMAL HIGH (ref 6–20)
CHLORIDE: 102 mmol/L (ref 101–111)
CO2: 23 mmol/L (ref 22–32)
CREATININE: 0.76 mg/dL (ref 0.44–1.00)
Calcium: 8.5 mg/dL — ABNORMAL LOW (ref 8.9–10.3)
GFR calc non Af Amer: >60 mL/min (ref >60)
Glucose, Bld: 129 mg/dL — ABNORMAL HIGH (ref 65–99)
POTASSIUM: 4.2 mmol/L (ref 3.5–5.1)
Sodium: 135 mmol/L (ref 135–145)

## 2015-01-14 LAB — CBC
HEMATOCRIT: 30.4 % — AB (ref 36.0–46.0)
HEMOGLOBIN: 9.5 g/dL — AB (ref 12.0–15.0)
MCH: 25.1 pg — AB (ref 26.0–34.0)
MCHC: 31.3 g/dL (ref 30.0–36.0)
MCV: 80.4 fL (ref 78.0–100.0)
Platelets: 406 10*3/uL — ABNORMAL HIGH (ref 150–400)
RBC: 3.78 MIL/uL — AB (ref 3.87–5.11)
RDW: 17.7 % — ABNORMAL HIGH (ref 11.5–15.5)
WBC: 12.7 10*3/uL — ABNORMAL HIGH (ref 4.0–10.5)

## 2015-01-14 NOTE — Progress Notes (Signed)
Patient ID: Kristina Phelps, female   DOB: 1935-03-27, 79 y.o.   MRN: [id number] 4 Days Post-Op Procedure(s) (LRB): EXPLORATORY LAPAROTOMY ,BIOPSY OF PELVIC MASS (Bilateral)  Subjective: Less nausea.  She denies flatus or a BM  Objective: Vital signs in last 24 hours: Temp:  [98.1 F (36.7 C)-98.7 F (37.1 C)] 98.7 F (37.1 C) (12/03 0500) Pulse Rate:  [86-103] 103 (12/03 0500) Resp:  [16-18] 18 (12/02 2144) BP: (108-155)/(70-90) 145/71 mmHg (12/03 0500) SpO2:  [93 %-94 %] 94 % (12/03 0500)    Intake/Output from previous day: 12/02 0701 - 12/03 0700 In: 1875 [I.V.:1800; IV Piggyback:75] Out: 100 [Urine:100]  Physical Examination: General: alert, cooperative, no distress and pleasantly confused intermittently, looks to husband when answering questions at times Resp: clear to auscultation bilaterally Cardio: regular rate and rhythm, S1, S2 normal, no murmur, click, rub or gallop GI: incision: midline incision with honeycomb dressing without active bleeding or drainage and abdomen distended, normoactive bowel sounds, tympanic on percussion, non-tender, mild ecchymosis noted around the incision on the abdomen Extremities: BLE without edema, left upper arm erythematous--decreased from prior exam Wound to the left upper arm near the axilla with intact dressing.    Labs: WBC/Hgb/Hct/Plts:  12.7/9.5/30.4/406 (12/03 0550) BUN/Cr/glu/ALT/AST/amyl/lip:  29/0.76/--/--/--/--/-- (12/03 0550)  Assessment: 79 y.o. s/p Procedure(s): EXPLORATORY LAPAROTOMY ,BIOPSY OF PELVIC MASS: stable Pain:  Pain is well-controlled on PRN medications.  Heme: Anemia of chronic disease.  Stable.  ID: Left upper extremity cellulitis post-op--improving.  Chronic wound to left upper arm near the axilla without signs of infection.    CV: BP and HR stable post-operatively.  Continue to monitor.      GI:  Postop ileus. Tolerating po: No.  Less nausea  Zofran ordered PRN.       Marland Kitchen  Prophylaxis: pharmacologic  prophylaxis (with any of the following: enoxaparin (Lovenox) 40mg  SQ 2 hours prior to surgery then every day) and intermittent pneumatic compression boots.    Plan: NPO except sips of clears until post-op ileus resolves Meds to IV (Bystolic and cozaar changed to metoprolol IV per pharmacy recommendation, Vimpat to IV) Ancef 1 G Q8H for LUE cellulitis IV Tylenol and  Hydromorphone IV PRN Dulcolax suppository daily until flatus or BM Repeat labs in the am IVF D5NS at 75 cc/hr Monitor intake and output    LOS: 4 days    JACKSON-MOORE,Azzan Butler A 01/14/2015, 10:37 AM

## 2015-01-15 ENCOUNTER — Encounter (HOSPITAL_COMMUNITY): Payer: Self-pay | Admitting: Radiology

## 2015-01-15 ENCOUNTER — Inpatient Hospital Stay (HOSPITAL_COMMUNITY): Payer: Medicare Other

## 2015-01-15 LAB — CBC
HCT: 31 % — ABNORMAL LOW (ref 36.0–46.0)
Hemoglobin: 9.5 g/dL — ABNORMAL LOW (ref 12.0–15.0)
MCH: 24.9 pg — ABNORMAL LOW (ref 26.0–34.0)
MCHC: 30.6 g/dL (ref 30.0–36.0)
MCV: 81.2 fL (ref 78.0–100.0)
PLATELETS: 390 10*3/uL (ref 150–400)
RBC: 3.82 MIL/uL — ABNORMAL LOW (ref 3.87–5.11)
RDW: 17.7 % — AB (ref 11.5–15.5)
WBC: 12.1 10*3/uL — AB (ref 4.0–10.5)

## 2015-01-15 LAB — BASIC METABOLIC PANEL
ANION GAP: 8 (ref 5–15)
BUN: 20 mg/dL (ref 6–20)
CALCIUM: 8.4 mg/dL — AB (ref 8.9–10.3)
CO2: 24 mmol/L (ref 22–32)
CREATININE: 0.64 mg/dL (ref 0.44–1.00)
Chloride: 106 mmol/L (ref 101–111)
Glucose, Bld: 118 mg/dL — ABNORMAL HIGH (ref 65–99)
Potassium: 3.6 mmol/L (ref 3.5–5.1)
Sodium: 138 mmol/L (ref 135–145)

## 2015-01-15 MED ORDER — LIDOCAINE HCL 2 % EX GEL
1.0000 "application " | Freq: Once | CUTANEOUS | Status: AC
  Administered 2015-01-15: 1 via TOPICAL
  Filled 2015-01-15: qty 5

## 2015-01-15 MED ORDER — IOHEXOL 300 MG/ML  SOLN
75.0000 mL | Freq: Once | INTRAMUSCULAR | Status: AC | PRN
  Administered 2015-01-15: 75 mL via INTRAVENOUS

## 2015-01-15 MED ORDER — LORAZEPAM 2 MG/ML IJ SOLN
0.5000 mg | Freq: Once | INTRAMUSCULAR | Status: AC
  Administered 2015-01-15: 0.5 mg via INTRAVENOUS
  Filled 2015-01-15: qty 1

## 2015-01-15 MED ORDER — PHENOL 1.4 % MT LIQD
1.0000 | OROMUCOSAL | Status: DC | PRN
  Administered 2015-01-15: 1 via OROMUCOSAL
  Filled 2015-01-15: qty 177

## 2015-01-15 NOTE — Progress Notes (Signed)
Pt noted to be in severe pain and teary. Not time yet for another dose of pain med. Dr. Delsa Sale made aware. Said it's OK to give pt another dose of pain med now. Med given. Vwilliams,rn.

## 2015-01-15 NOTE — Progress Notes (Signed)
Patient ID: Kristina Phelps, female   DOB: 1935/12/01, 79 y.o.   MRN: [id number] 5 Days Post-Op Procedure(s) (LRB): EXPLORATORY LAPAROTOMY ,BIOPSY OF PELVIC MASS (Bilateral)  Subjective: Less nausea.  She denies flatus or Phelps BM  Objective: Vital signs in last 24 hours: Temp:  [97.7 F (36.5 C)-98.9 F (37.2 C)] 98.3 F (36.8 C) (12/04 0600) Pulse Rate:  [81-92] 89 (12/04 0600) BP: (123-147)/(61-72) 144/72 mmHg (12/04 0600) SpO2:  [92 %-96 %] 95 % (12/04 0600) Last BM Date:  (unknown)  Intake/Output from previous day: 12/03 0701 - 12/04 0700 In: 1900 [I.V.:1800; IV Piggyback:100] Out: 300 [Urine:300]  Physical Examination: General: alert, cooperative, no distress and pleasantly confused intermittently, looks to husband when answering questions at times Resp: good air movement; occasional wheezes Cardio: regular rate and rhythm, S1, S2 normal, no murmur, click, rub or gallop GI: incision: midline incision with honeycomb dressing without active bleeding or drainage and abdomen distended, normoactive bowel sounds, tympanic on percussion, non-tender, mild ecchymosis noted around the incision on the abdomen Extremities: BLE without edema, left upper arm erythematous--decreased from prior exam Wound to the left upper arm near the axilla with intact dressing.          Assessment: 79 y.o. s/p Procedure(s): EXPLORATORY LAPAROTOMY ,BIOPSY OF PELVIC MASS: stable Pain:  Pain is well-controlled on PRN medications.  Heme: Anemia of chronic disease.  Stable.  ID: Left upper extremity cellulitis post-op--improving.  Chronic wound to left upper arm near the axilla without signs of infection.    CV: BP and HR stable post-operatively.  Continue to monitor.      GI:  Postop ileus. Tolerating po: No.  Less nausea  Zofran ordered PRN.       Marland Kitchen Prophylaxis: pharmacologic prophylaxis (with any of the following: enoxaparin (Lovenox) 40mg  SQ 2 hours prior to surgery then every day) and  intermittent pneumatic compression boots.    Plan: Ferrel Logan of clears Check labs today CXR/AXR Ancef 1 G Q8H for LUE cellulitis Dulcolax suppository daily until flatus or BM Monitor intake and output    LOS: 5 days    JACKSON-MOORE,Kristina Phelps 01/15/2015, 10:42 AM

## 2015-01-16 LAB — CBC
HEMATOCRIT: 29.4 % — AB (ref 36.0–46.0)
HEMOGLOBIN: 9.2 g/dL — AB (ref 12.0–15.0)
MCH: 25.3 pg — AB (ref 26.0–34.0)
MCHC: 31.3 g/dL (ref 30.0–36.0)
MCV: 81 fL (ref 78.0–100.0)
Platelets: 321 10*3/uL (ref 150–400)
RBC: 3.63 MIL/uL — ABNORMAL LOW (ref 3.87–5.11)
RDW: 17.8 % — ABNORMAL HIGH (ref 11.5–15.5)
WBC: 13.2 10*3/uL — ABNORMAL HIGH (ref 4.0–10.5)

## 2015-01-16 LAB — COMPREHENSIVE METABOLIC PANEL
ALK PHOS: 150 U/L — AB (ref 38–126)
ALT: 11 U/L — ABNORMAL LOW (ref 14–54)
ANION GAP: 9 (ref 5–15)
AST: 35 U/L (ref 15–41)
Albumin: 2.2 g/dL — ABNORMAL LOW (ref 3.5–5.0)
BILIRUBIN TOTAL: 0.3 mg/dL (ref 0.3–1.2)
BUN: 20 mg/dL (ref 6–20)
CALCIUM: 8.3 mg/dL — AB (ref 8.9–10.3)
CO2: 24 mmol/L (ref 22–32)
CREATININE: 0.61 mg/dL (ref 0.44–1.00)
Chloride: 106 mmol/L (ref 101–111)
Glucose, Bld: 121 mg/dL — ABNORMAL HIGH (ref 65–99)
Potassium: 3.5 mmol/L (ref 3.5–5.1)
Sodium: 139 mmol/L (ref 135–145)
Total Protein: 5.6 g/dL — ABNORMAL LOW (ref 6.5–8.1)

## 2015-01-16 LAB — MAGNESIUM: Magnesium: 1.7 mg/dL (ref 1.7–2.4)

## 2015-01-16 LAB — PHOSPHORUS: PHOSPHORUS: 2.4 mg/dL — AB (ref 2.5–4.6)

## 2015-01-16 MED ORDER — RIVAROXABAN 20 MG PO TABS
20.0000 mg | ORAL_TABLET | Freq: Every day | ORAL | Status: DC
Start: 2015-01-17 — End: 2015-01-17
  Administered 2015-01-17: 20 mg via ORAL
  Filled 2015-01-16 (×2): qty 1

## 2015-01-16 MED ORDER — LACOSAMIDE 50 MG PO TABS
200.0000 mg | ORAL_TABLET | Freq: Two times a day (BID) | ORAL | Status: DC
  Administered 2015-01-16 – 2015-01-17 (×2): 200 mg via ORAL
  Filled 2015-01-16 (×2): qty 4

## 2015-01-16 MED ORDER — TRAMADOL HCL 50 MG PO TABS
50.0000 mg | ORAL_TABLET | Freq: Four times a day (QID) | ORAL | Status: DC | PRN
  Administered 2015-01-16 – 2015-01-17 (×2): 50 mg via ORAL
  Filled 2015-01-16 (×2): qty 1

## 2015-01-16 MED ORDER — CEPHALEXIN 500 MG PO CAPS
500.0000 mg | ORAL_CAPSULE | Freq: Four times a day (QID) | ORAL | Status: DC
  Administered 2015-01-16 – 2015-01-17 (×4): 500 mg via ORAL
  Filled 2015-01-16 (×7): qty 1

## 2015-01-16 MED ORDER — NEBIVOLOL HCL 10 MG PO TABS
10.0000 mg | ORAL_TABLET | Freq: Every day | ORAL | Status: DC
  Administered 2015-01-16 – 2015-01-17 (×2): 10 mg via ORAL
  Filled 2015-01-16 (×2): qty 1

## 2015-01-16 MED ORDER — LOSARTAN POTASSIUM 50 MG PO TABS
100.0000 mg | ORAL_TABLET | Freq: Every day | ORAL | Status: DC
  Administered 2015-01-16 – 2015-01-17 (×2): 100 mg via ORAL
  Filled 2015-01-16 (×2): qty 2

## 2015-01-16 NOTE — Progress Notes (Deleted)
CSW rec'd call from unit RN asking that CSW call step-daughter- Kristina Phelps [phone]. CSW spoke with Kristina Phelps who is interested in looking at [facility] for patient at [place] or [facility]. CSW attempted to speak with patient but found he was unable to engage. CSW has attempted to reach son, Kristina Phelps, to discuss dc plans but no answer and no VM set up.  incoherent. CSW will pursue SNF options and communicate with family further for appropriate dc planning.   Eduard Clos, MSW, [place]

## 2015-01-16 NOTE — Progress Notes (Signed)
Patient is confused at times. Husband at  Bedside. NG removed and IV saline locked. Patient had medium loose BM. No complaints of pain. Wound culture of left upper arm sent to lab. Patient ate soup for lunch and so far tolerating well. Will continue to monitor.

## 2015-01-16 NOTE — Progress Notes (Signed)
Updated [facility] SN[facility] that patient is hopefully ready for SNF transfer later this week (?tomorrow).  CSW will follow up tomorrow for further planning.  Eduard Clos, MSW, [place]

## 2015-01-16 NOTE — Care Management Important Message (Signed)
Important Message  Patient Details  Name: SAIRA DRUMMONDS MRN: [id number] Date of Birth: 10/11/1935   Medicare Important Message Given:  Yes    Shelda Altes 01/16/2015, 1:25 PMImportant Message  Patient Details  Name: JULIANIE SINNETT MRN: [id number] Date of Birth: 01/30/1936   Medicare Important Message Given:  Yes    Shelda Altes 01/16/2015, 1:25 PM

## 2015-01-16 NOTE — Progress Notes (Signed)
Telephone order from Belleair Shore, MD to place NG Tube pending pt and spouse approval.  Talked with spouse and pt about benefits of NG tube and insertion technique. Pt and spouse agreed to procedure. Attempted to insert NG tube in R nare. Pt highly anxious, unable to take deep breaths, remain calm, and vomited green bilious fluid. Obtained order for Ativan IV and lidocaine Topical to assist with NG tube insertion. Attempted to insert NG tube again, but in L nare. Insertion successful. Pt tolerated procedure well. 350ccs of green and bilious output noted immediately and recorded. Will continue to monitor.  Kristina Phelps

## 2015-01-16 NOTE — Progress Notes (Signed)
Physical Therapy Treatment Patient Details Name: Kristina Phelps MRN: [id number] DOB: [birth year] Today's Date: 01/16/2015    History of Present Illness Exploratory laparotomy with biopsy of pelvic mass    PT Comments    97% 2L supine 94% RA supine 90% RA seated EOB 92% RA BSC 90% RA ambulation ~17' 90% RA seated recliner 96% 2L seated recliner   Pt. Endurance limited today; required max assist for supine to sit to bring trunk upright and navigate BLE with multiple posterior LOB; upon seated EOB pt. Still presented with posterior LOB and required external assist to stabilize from husband; requires min-mod assist for transfers (STS and SPT) for rise and controlled descent, 75% VC for proper hand placement; pt. Ambulated ~17' in hallway with RW with 75% VC for directions, HHA from therapist and husband on each side with min-mod assist in navigation of RW as pt. Tends to not initiate steps/stops and drifts right/left with decreased safety awareness; HIGH FALL RISK    Follow Up Recommendations  SNF;Supervision/Assistance - 24 hour     Equipment Recommendations  None recommended by PT    Recommendations for Other Services       Precautions / Restrictions Precautions Precautions: Fall Restrictions Weight Bearing Restrictions: No Other Position/Activity Restrictions: Kristina Phelps is a 79 y.o. female with history of hypertension, prior TIA, seizures, remote breast cancer, right lower extremity DVT diagnosed in September 2016 treated with Xarelto and now with large abdominopelvic mass. S/P   expl lap 11/29 and biopsy, h/o IVC filter.    Mobility  Bed Mobility Overal bed mobility: Needs Assistance Bed Mobility: Supine to Sit     Supine to sit: Max assist;HOB elevated     General bed mobility comments: assist with LEs and trunk, incr time  Transfers Overall transfer level: Needs assistance Equipment used: Rolling walker (2 wheeled) Transfers: Sit to/from [employer] Sit to Stand: Mod assist;Min assist Stand pivot transfers: Mod assist;Min assist       General transfer comment: 75% VC for proper hand placement; mod assist to rise and for controlled descent  Ambulation/Gait Ambulation/Gait assistance: Min assist;Mod assist;+2 safety/equipment Ambulation Distance (Feet): 17 Feet Assistive device: Rolling walker (2 wheeled);2 person hand held assist Gait Pattern/deviations: Step-to pattern;Step-through pattern;Drifts right/left;Trunk flexed Gait velocity: decreased   General Gait Details: 75% VC for directions, HHA from therapist and husband on each side with min-mod assist in navigation of RW as pt. tends to not initiate/stops and drifts right/left with decreased safety awareness; limited by fatigue today    Stairs            Wheelchair Mobility    Modified Rankin (Stroke Patients Only)       Balance                                    Cognition Arousal/Alertness: Awake/alert Behavior During Therapy: WFL for tasks assessed/performed Overall Cognitive Status: Impaired/Different from baseline Area of Impairment: Orientation;Attention;Following commands;Safety/judgement;Awareness;Problem solving Orientation Level: Place;Time;Situation Current Attention Level: Sustained Memory: Decreased recall of precautions;Decreased short-term memory Following Commands: Follows one step commands inconsistently Safety/Judgement: Decreased awareness of safety   Problem Solving: Slow processing;Difficulty sequencing;Requires verbal cues;Requires tactile cues      Exercises      General Comments        Pertinent Vitals/Pain Pain Assessment: No/denies pain    Home Living  Prior Function            PT Goals (current goals can now be found in the care plan section) Acute Rehab PT Goals Time For Goal Achievement: 01/25/15 Potential to Achieve Goals: Fair Progress towards PT goals:  Progressing toward goals    Frequency  Min 3X/week    PT Plan Current plan remains appropriate    Co-evaluation             End of Session Equipment Utilized During Treatment: Gait belt Activity Tolerance: Patient tolerated treatment well Patient left: in chair;with call bell/phone within reach;with chair alarm set;with family/visitor present     Time: [id number] PT Time Calculation (min) (ACUTE ONLY): 30 min  Charges:  $Gait Training: 8-22 mins $Therapeutic Activity: 8-22 mins                    G CodesDenna Haggard, SPTA   01/16/2015 3:59 PM   Pager: [phone]   Reviewed and agree with above Rica Koyanagi  PTA [facility]  Acute  Rehab Pager      [phone]

## 2015-01-16 NOTE — Progress Notes (Signed)
Patient ID: Kristina Phelps, female   DOB: 1935-10-24, 79 y.o.   MRN: [id number] 6 Days Post-Op Procedure(s) (LRB): EXPLORATORY LAPAROTOMY ,BIOPSY OF PELVIC MASS (Bilateral)  Subjective: NGT in, + flatus this morning. Ambulating in halls with assistance  Objective: Vital signs in last 24 hours: Temp:  [98.6 F (37 C)-99 F (37.2 C)] 98.6 F (37 C) (12/05 0550) Pulse Rate:  [71-114] 71 (12/05 0550) Resp:  [16-18] 16 (12/05 0550) BP: (135-150)/(68-76) 135/71 mmHg (12/05 0550) SpO2:  [91 %-95 %] 93 % (12/05 0550) Last BM Date:  (unknown)  Intake/Output from previous day: 12/04 0701 - 12/05 0700 In: 2110.4 [I.V.:1820.4; IV Piggyback:290] Out: 350 [Emesis/NG output:350]  Physical Examination: General: alert, cooperative, no distress and pleasantly confused intermittently, looks to husband when answering questions at times Resp: good air movement; occasional wheezes Cardio: regular rate and rhythm, S1, S2 normal, no murmur, click, rub or gallop GI: incision: midline incision with honeycomb dressing without active bleeding or drainage and abdomen nondistended, normoactive bowel sounds, non-tender, mild ecchymosis noted around the incision on the abdomen Extremities: BLE without edema, left upper arm erythematous--decreased from prior exam Wound to the left upper arm near the axilla with intact dressing.     WBC/Hgb/Hct/Plts:  13.2/9.2/29.4/321 (12/05 0700) BUN/Cr/glu/ALT/AST/amyl/lip:  20/0.61/--/11/35/--/-- (12/05 0700)  Assessment: 79 y.o. s/p Procedure(s): EXPLORATORY LAPAROTOMY ,BIOPSY OF PELVIC MASS: stable Pain:  Pain is well-controlled on PRN medications.  Heme: Anemia of chronic disease.  Stable.  ID: Left upper extremity cellulitis post-op--improving but WBC increased today to 13,000. Will test for MRSA in chronic wound to left upper arm near the axilla and consider changing antibiotics to bactrim. Afebrile with no objective signs of sepsis.     CV: BP and HR stable  post-operatively.  Continue to monitor.      GI:  Postop ileus. Appears to be improving with flatus. D.c. NGT and will attempt PO today.       Marland Kitchen Prophylaxis: pharmacologic prophylaxis (with any of the following: enoxaparin (Lovenox) 40mg  SQ 2 hours prior to surgery then every day) and intermittent pneumatic compression boots.    Plan: NGT discontinued, re attempt oral intake. Anticipate likely discharge to nursing home within 24-48 hours.  Discussed that pathology showed likely endometrial cancer (high grade). Will facilitate ambulatory consultation with Dr Marko Plume to consider chemotherapy.    LOS: 6 days    Donaciano Eva 01/16/2015, 9:40 AM

## 2015-01-17 ENCOUNTER — Telehealth: Payer: Self-pay

## 2015-01-17 MED ORDER — TRAMADOL HCL 50 MG PO TABS: 50.0000 mg | ORAL_TABLET | Freq: Four times a day (QID) | ORAL | Status: AC | PRN

## 2015-01-17 MED ORDER — CEPHALEXIN 500 MG PO CAPS: 500.0000 mg | ORAL_CAPSULE | Freq: Four times a day (QID) | ORAL | Status: AC

## 2015-01-17 NOTE — Telephone Encounter (Signed)
Orders received from Joylene John , [place] to contact [facility] : [phone]  , Fax [phone] to place a referral for Ms Barthell with diagnosis of Endometrial Cancer . Barnett Applebaum Referral Specialist was not available , left a detailed message with referral request , requested call back to confirm referral request with Dr Hinton Rao , call back information given .

## 2015-01-17 NOTE — Discharge Summary (Signed)
Physician Discharge Summary  Patient ID: Kristina Phelps MRN: [id number] DOB/AGE: 77-Jul-1937 77 y.o.  Admit date: 01/10/2015 Discharge date: 01/17/2015  Admission Diagnoses: Pelvic mass in female  Discharge Diagnoses:  Principal Problem:   Pelvic mass in female Active Problems:   Delirium due to general medical condition   Protein-calorie malnutrition, severe ([place])   Anemia   Discharged Condition:  The patient is in stable condition and cleared for discharge per Dr. Delsa Sale.    Hospital Course: On 01/10/2015, the patient underwent the following: Procedure(s): EXPLORATORY LAPAROTOMY ,BIOPSY OF PELVIC MASS.  The postoperative course was complicated by post-operative ileus with nausea and emesis and left upper extremity cellulitis.  CT AP on 01/15/15 and chest xray as well due to wheezing.  NG tube was placed on 01/15/15 and removed 01/16/15.  She was discharged to SNF on postoperative day 7 tolerating a regular diet, voiding, minimal pain.  She is discharged to SNF with keflex 500 Q6H until 01/19/15 (one week course per Dr. Delsa Sale) to treat LUE cellulitis.  O2 2 L via nasal cannula to keep sats above 92%.  Referral has been made to [facility] for the patient to meet with Dr. Hinton Rao to discuss chemotherapy.   Consults: Social Work, PT  Significant Diagnostic Studies: Labs, chest xray, CT AP  Treatments: surgery: see above, antibiotics for LUE cellulitis  Discharge Exam: Blood pressure 133/64, pulse 82, temperature 98.6 F (37 C), temperature source Oral, resp. rate 16, height 5\' 4"  (1.626 m), weight 150 lb (68.04 kg), SpO2 92 %. General appearance: alert, cooperative, no distress and pleasantly confused Resp: wheezes noted in the right upper and lower lobes (Dr. Delsa Sale aware) Cardio: regular rate and rhythm, S1, S2 normal, no murmur, click, rub or gallop GI: abdomen firm, active bowel sounds, non-tympanic, incontinent of stool Extremities: extremities  normal, atraumatic, no cyanosis or edema Incision/Wound: midline incision dressing removed, no erythema or drainage noted, mild ecchymosis around the incision. Incontinent of urine, 89-90% on RA (Dr. Delsa Sale aware) Sacrum without evidence of breakdown-mepilex dressing intact for prevention.  Bilateral heels with mild erythema (more on the left) with no breakdown present. Left upper extremity chronic wound with no organisms on preliminary culture results.  Lymphedema present but improved from previous assessment along with erythema. Patient has been regurgitating small amounts of clear liquid (Dr. Delsa Sale aware)  Disposition: SNF-[facility]      Discharge Instructions    Call MD for:  difficulty breathing, headache or visual disturbances    Complete by:  As directed      Call MD for:  extreme fatigue    Complete by:  As directed      Call MD for:  hives    Complete by:  As directed      Call MD for:  persistant dizziness or light-headedness    Complete by:  As directed      Call MD for:  persistant nausea and vomiting    Complete by:  As directed      Call MD for:  redness, tenderness, or signs of infection (pain, swelling, redness, odor or green/yellow discharge around incision site)    Complete by:  As directed      Call MD for:  severe uncontrolled pain    Complete by:  As directed      Call MD for:  temperature >100.4    Complete by:  As directed      Diet - low sodium heart healthy    Complete  by:  As directed      Discharge instructions    Complete by:  As directed   Keep sats above 92% with 2 L nasal cannula as needed.     Increase activity slowly    Complete by:  As directed             Medication List    TAKE these medications        cephALEXin 500 MG capsule  Commonly known as:  KEFLEX  Take 1 capsule (500 mg total) by mouth every 6 (six) hours.     lacosamide 200 MG Tabs tablet  Commonly known as:  VIMPAT  Take 200 mg by mouth 2 (two) times  daily.     losartan 100 MG tablet  Commonly known as:  COZAAR  Take 100 mg by mouth daily.     nebivolol 10 MG tablet  Commonly known as:  BYSTOLIC  Take 10 mg by mouth daily.     rivaroxaban 20 MG Tabs tablet  Commonly known as:  XARELTO  Take 20 mg by mouth daily with supper.     simvastatin 20 MG tablet  Commonly known as:  ZOCOR  Take 20 mg by mouth at bedtime.     traMADol 50 MG tablet  Commonly known as:  ULTRAM  Take 1 tablet (50 mg total) by mouth every 6 (six) hours as needed for moderate pain or severe pain.       Follow-up Information    Follow up with [facility] .   Specialty:  [place] information:   [address] [place] [address] [phone]      Greater than thirty minutes were spend for face to face discharge instructions and discharge orders/summary in EPIC.   Signed: Kale Rondeau DEAL 01/17/2015, 1:06 PM

## 2015-01-17 NOTE — Progress Notes (Signed)
Patient for d/c today to SNF bed at [facility] in [place]. Husband and patient agreeable to this plan- plan transfer via EMS. Eduard Clos, MSW, [place]

## 2015-01-17 NOTE — Clinical Social Work Placement (Signed)
   CLINICAL SOCIAL WORK PLACEMENT  NOTE  Date:  01/17/2015  Patient Details  Name: Kristina Phelps MRN: [id number] Date of Birth: September 10, 1935  Clinical Social Work is seeking post-discharge placement for this patient at the [place] level of care (*CSW will initial, date and re-position this form in  chart as items are completed):  Yes   Patient/family provided with [place] Work Department's list of facilities offering this level of care within the geographic area requested by the patient (or if unable, by the patient's family).  Yes   Patient/family informed of their freedom to choose among providers that offer the needed level of care, that participate in Medicare, Medicaid or managed care program needed by the patient, have an available bed and are willing to accept the patient.  Yes   Patient/family informed of [facility]'s ownership interest in [facility] and [facility], as well as of the fact that they are under no obligation to receive care at these facilities.  PASRR submitted to [employer] on 01/11/15     PASRR number received on 01/11/15     Existing PASRR number confirmed on       FL2 transmitted to all facilities in geographic area requested by pt/family on 01/11/15     FL2 transmitted to all facilities within larger geographic area on       Patient informed that his/her managed care company has contracts with or will negotiate with certain facilities, including the following:        Yes   Patient/family informed of bed offers received.  Patient chooses bed at  ([place])     Physician recommends and patient chooses bed at      Patient to be transferred to   on 01/17/15.  Patient to be transferred to facility by  Corey Harold)     Patient family notified on   of transfer.  Name of family member notified:   (husband and patient at bedside)     PHYSICIAN       Additional Comment:     _______________________________________________ Ludwig Clarks, LCSW 01/17/2015, 2:13 PM

## 2015-01-17 NOTE — Progress Notes (Signed)
Report called to [facility]. Pt transport set up [facility]AR

## 2015-01-18 LAB — WOUND CULTURE: Culture: NO GROWTH

## 2015-01-18 NOTE — Telephone Encounter (Signed)
Received return call from [place] at [facility]. Patient records faxed to Abigail Butts at [phone] so she can schedule patient with Dr. Hinton Rao. Abigail Butts states she will call our office back with appointment once patient is scheduled.

## 2015-01-18 NOTE — Telephone Encounter (Signed)
Left VM requesting return call in regards to getting patient scheduled for new referral with Dr. Hinton Rao.

## 2015-01-25 DIAGNOSIS — D509 Iron deficiency anemia, unspecified: Secondary | ICD-10-CM | POA: Diagnosis not present

## 2015-01-25 DIAGNOSIS — R1900 Intra-abdominal and pelvic swelling, mass and lump, unspecified site: Secondary | ICD-10-CM | POA: Diagnosis not present

## 2015-01-25 DIAGNOSIS — C801 Malignant (primary) neoplasm, unspecified: Secondary | ICD-10-CM | POA: Diagnosis not present

## 2015-01-30 ENCOUNTER — Encounter: Payer: Self-pay | Admitting: Gynecologic Oncology

## 2015-01-30 NOTE — Progress Notes (Signed)
Gynecologic Oncology Multi-Disciplinary Disposition Conference Note  Date of the Conference: January 30, 2015  Patient Name: Kristina Phelps  Referring Provider: Dr. Lynnda Shields  Primary GYN Oncologist: Dr. 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  Disposition is to Palliative care/Hospice.   This Multidisciplinary conference took place involving physicians from [place], [place], Radiation Oncology, Pathology, Radiology along with the Gynecologic Oncology Nurse Practitioner and RN.  Comprehensive assessment of the patient's malignancy, staging, need for surgery, chemotherapy, radiation therapy, and need for further testing were reviewed. Supportive measures, both inpatient and following discharge were also discussed. The recommended plan of care is documented. Greater than 35 minutes were spent correlating and coordinating this patient's care.

## 2015-06-12 DEATH — deceased

## 2016-05-28 IMAGING — DX DG ABDOMEN 2V
4 series · 4 of 4 positions shown · non-contrast
Comparison: 12/29/2014 CT abdomen/ pelvis.

CLINICAL DATA: Wheezing.  Postoperative ileus.

EXAM:
ABDOMEN - 2 VIEW

[abdomen supine (1 of 2)]
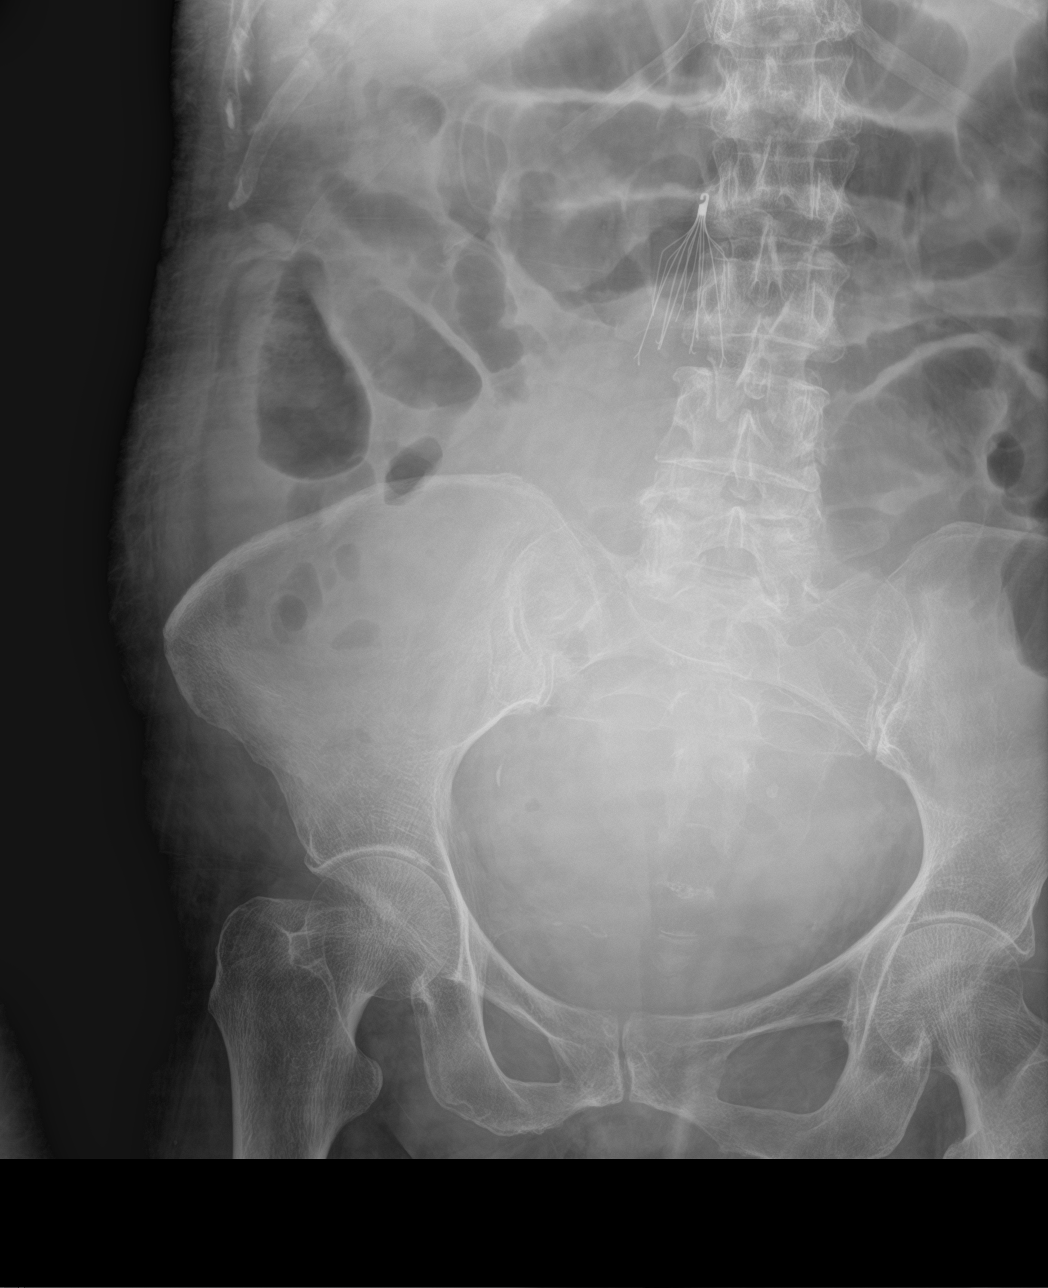

[abdomen supine (2 of 2)]
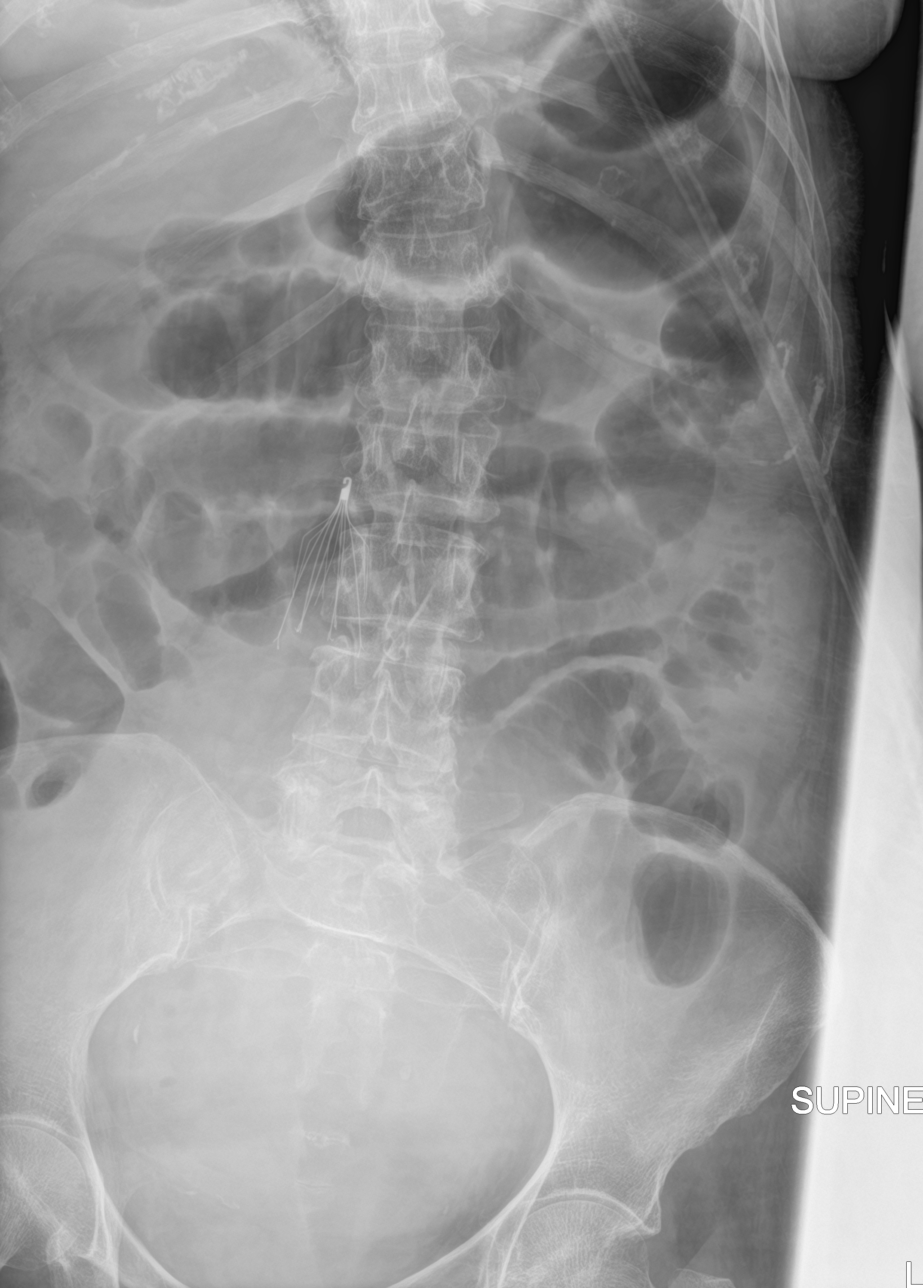

[abdomen decu (1 of 2)]
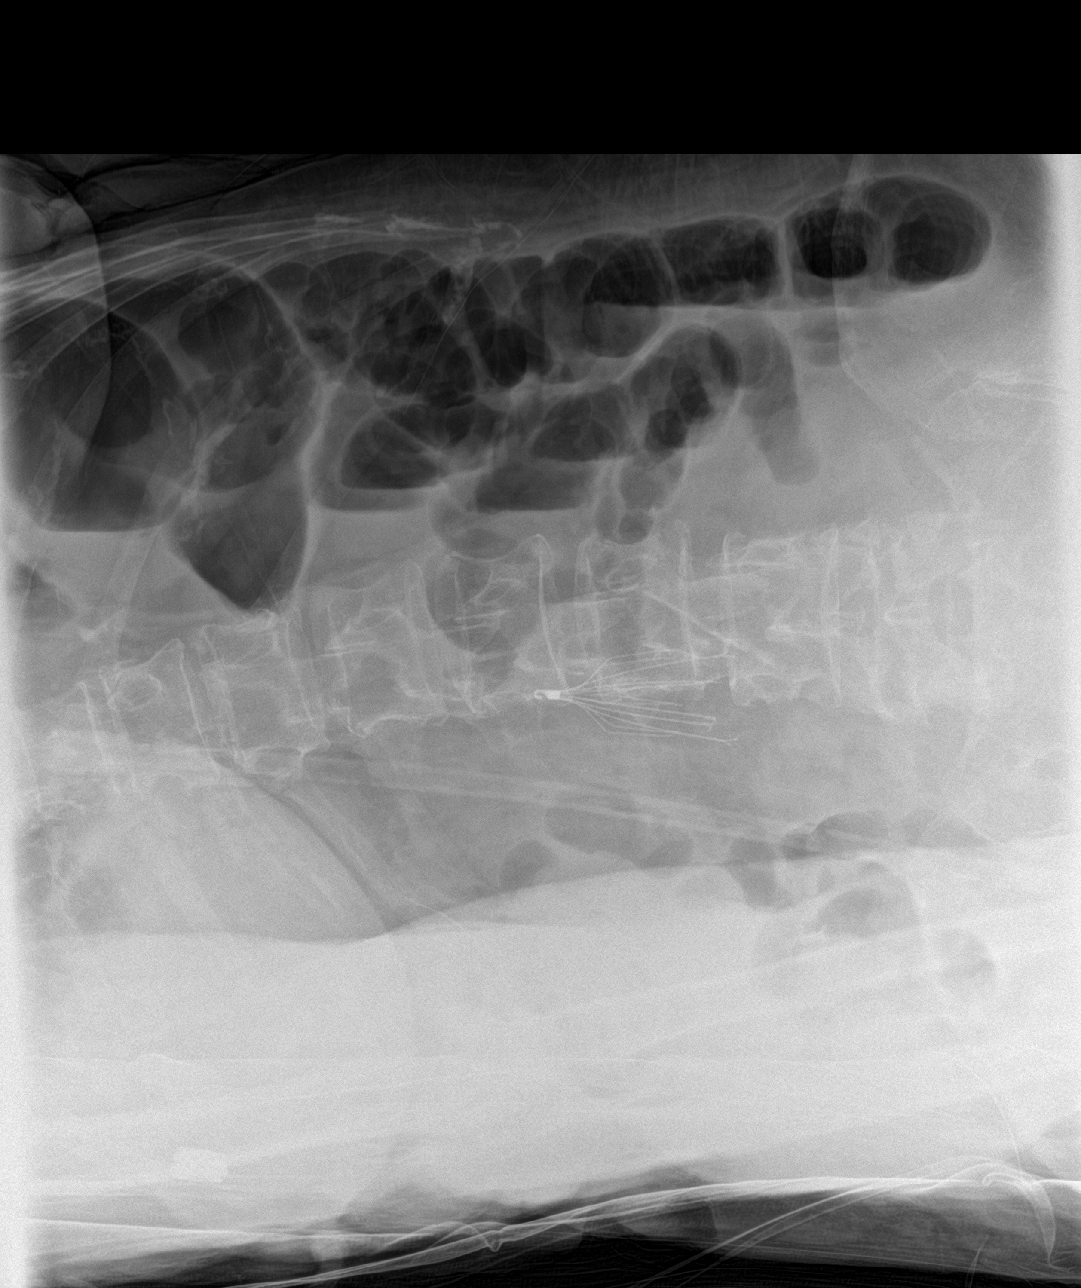

[abdomen decu (2 of 2)]
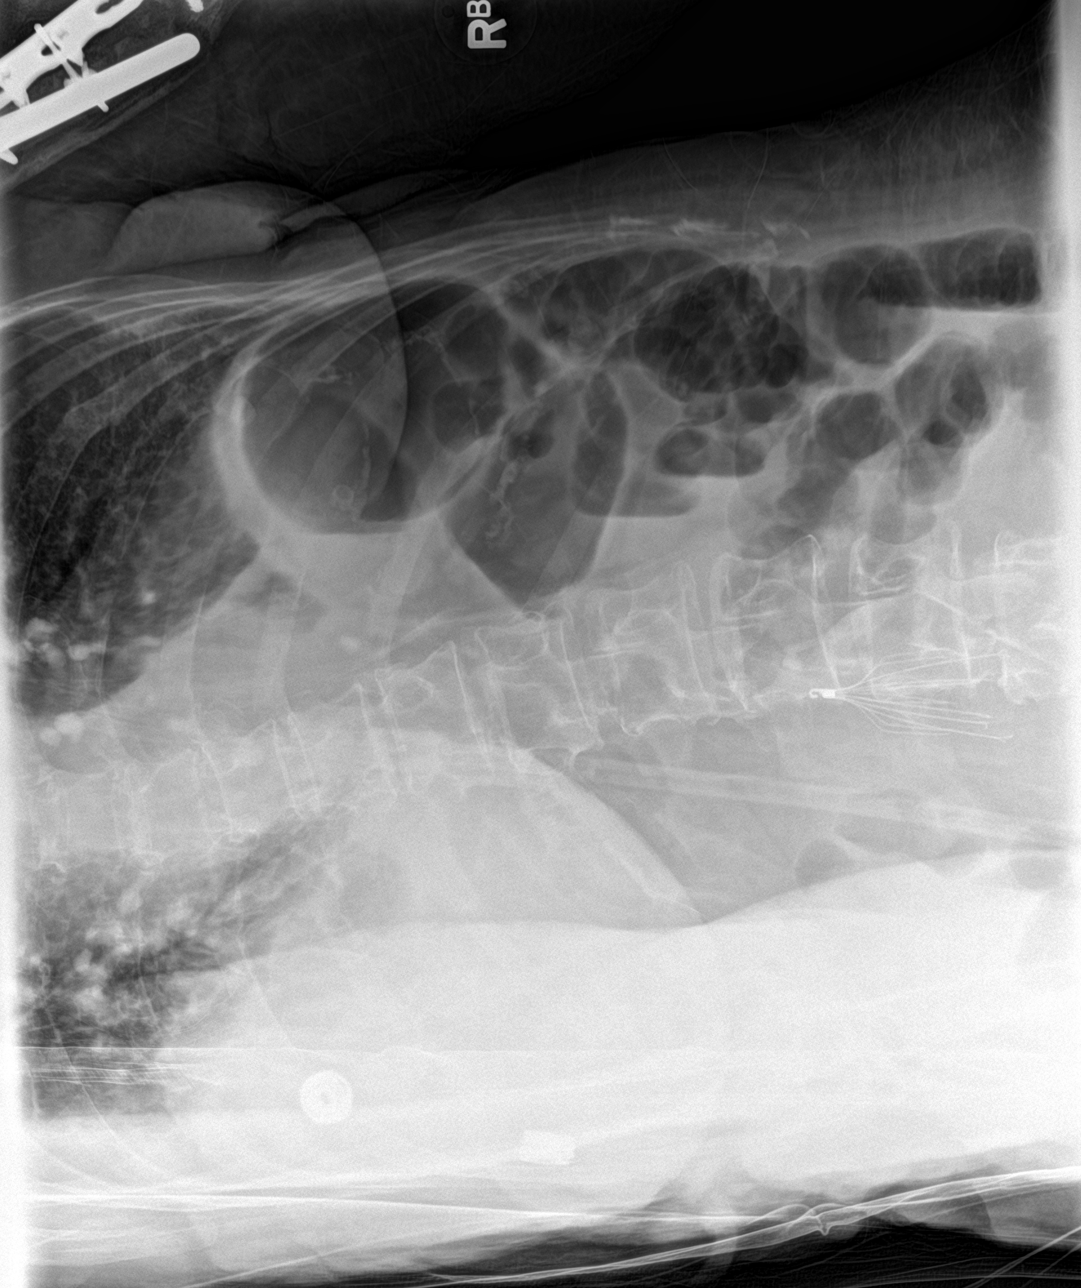

[4 of 4 positions shown; findings below may reference images not displayed]

FINDINGS: IVC filter is seen in the medial right abdomen in the expected
location. There mildly-to-moderately dilated small bowel loops
throughout the abdomen measuring up to 4.6 cm in diameter, with
air-fluid levels throughout the dilated small bowel loops. Mild
gaseous distention of the stomach. Minimal colonic gas. No evidence
of pneumatosis or pneumoperitoneum. Bibasilar lung opacities.
IMPRESSION: Mildly-to-moderately dilated small bowel loops with air-fluid levels
throughout the abdomen. Minimal colonic gas. In the postoperative
setting, the most likely diagnosis is a diffuse adynamic ileus of
the small bowel. A distal small bowel obstruction cannot be
excluded. No evidence of pneumoperitoneum.
